# Patient Record
Sex: Female | Born: 1960 | Race: Black or African American | Hispanic: No | Marital: Married | Smoking: Never smoker
Health system: Southern US, Community
[De-identification: ages and names within clinical notes are randomized; demographics above are authoritative.]

## PROBLEM LIST (undated history)

## (undated) DIAGNOSIS — E039 Hypothyroidism, unspecified: Secondary | ICD-10-CM

## (undated) HISTORY — PX: TONSILLECTOMY: SUR1361

## (undated) HISTORY — PX: OTHER SURGICAL HISTORY: SHX169

## (undated) HISTORY — PX: LAPAROSCOPIC GASTRIC SLEEVE RESECTION: SHX5895

---

## 2004-12-03 ENCOUNTER — Ambulatory Visit: Payer: Self-pay | Admitting: Family Medicine

## 2005-01-15 ENCOUNTER — Ambulatory Visit: Payer: Self-pay | Admitting: Family Medicine

## 2005-08-14 ENCOUNTER — Ambulatory Visit: Payer: Self-pay | Admitting: Obstetrics and Gynecology

## 2006-03-17 ENCOUNTER — Ambulatory Visit: Payer: Self-pay | Admitting: *Deleted

## 2007-11-09 ENCOUNTER — Ambulatory Visit: Payer: Self-pay | Admitting: Family Medicine

## 2009-01-04 ENCOUNTER — Ambulatory Visit: Payer: Self-pay | Admitting: Family Medicine

## 2011-01-03 ENCOUNTER — Ambulatory Visit: Payer: Self-pay | Admitting: Family Medicine

## 2012-01-07 ENCOUNTER — Ambulatory Visit: Payer: Self-pay | Admitting: Family Medicine

## 2012-12-29 ENCOUNTER — Other Ambulatory Visit: Payer: Self-pay | Admitting: Bariatrics

## 2012-12-29 DIAGNOSIS — E669 Obesity, unspecified: Secondary | ICD-10-CM

## 2013-01-04 ENCOUNTER — Ambulatory Visit: Payer: Self-pay | Admitting: Bariatrics

## 2013-01-04 LAB — COMPREHENSIVE METABOLIC PANEL
Anion Gap: 3 — ABNORMAL LOW (ref 7–16)
Calcium, Total: 10 mg/dL (ref 8.5–10.1)
Co2: 30 mmol/L (ref 21–32)
Creatinine: 0.53 mg/dL — ABNORMAL LOW (ref 0.60–1.30)
Osmolality: 274 (ref 275–301)
Potassium: 3.9 mmol/L (ref 3.5–5.1)
SGOT(AST): 21 U/L (ref 15–37)
Sodium: 138 mmol/L (ref 136–145)
Total Protein: 7.5 g/dL (ref 6.4–8.2)

## 2013-01-04 LAB — AMYLASE: Amylase: 47 U/L (ref 25–115)

## 2013-01-04 LAB — CBC WITH DIFFERENTIAL/PLATELET
Basophil #: 0.1 10*3/uL (ref 0.0–0.1)
Basophil %: 0.9 %
Eosinophil #: 0.2 10*3/uL (ref 0.0–0.7)
Eosinophil %: 3.1 %
HGB: 11.8 g/dL — ABNORMAL LOW (ref 12.0–16.0)
Lymphocyte %: 38.9 %
MCH: 27.9 pg (ref 26.0–34.0)
MCHC: 33.7 g/dL (ref 32.0–36.0)
MCV: 83 fL (ref 80–100)
Monocyte #: 0.6 x10 3/mm (ref 0.2–0.9)
Neutrophil #: 3.1 10*3/uL (ref 1.4–6.5)
Neutrophil %: 48 %
Platelet: 423 10*3/uL (ref 150–440)
RBC: 4.25 10*6/uL (ref 3.80–5.20)
RDW: 14.6 % — ABNORMAL HIGH (ref 11.5–14.5)

## 2013-01-04 LAB — TSH: Thyroid Stimulating Horm: 0.015 u[IU]/mL — ABNORMAL LOW

## 2013-01-04 LAB — PROTIME-INR: Prothrombin Time: 12.6 secs (ref 11.5–14.7)

## 2013-01-04 LAB — FOLATE: Folic Acid: 14.9 ng/mL (ref 3.1–100.0)

## 2013-01-04 LAB — MAGNESIUM: Magnesium: 1.9 mg/dL

## 2013-01-04 LAB — IRON AND TIBC
Iron: 47 ug/dL — ABNORMAL LOW (ref 50–170)
Unbound Iron-Bind.Cap.: 264 ug/dL

## 2013-01-04 LAB — APTT: Activated PTT: 29.9 secs (ref 23.6–35.9)

## 2013-01-04 LAB — FERRITIN: Ferritin (ARMC): 53 ng/mL (ref 8–388)

## 2013-01-04 LAB — HEMOGLOBIN A1C: Hemoglobin A1C: 6.3 % (ref 4.2–6.3)

## 2013-01-07 ENCOUNTER — Ambulatory Visit
Admission: RE | Admit: 2013-01-07 | Discharge: 2013-01-07 | Disposition: A | Discharge status: Home / Self Care | Payer: BC Managed Care – PPO | Source: Ambulatory Visit | Attending: Bariatrics | Admitting: Bariatrics

## 2013-01-07 DIAGNOSIS — E669 Obesity, unspecified: Secondary | ICD-10-CM

## 2013-02-07 ENCOUNTER — Ambulatory Visit: Payer: Self-pay | Admitting: Bariatrics

## 2013-02-23 ENCOUNTER — Ambulatory Visit: Payer: Self-pay | Admitting: Bariatrics

## 2013-05-17 ENCOUNTER — Ambulatory Visit: Payer: Self-pay | Admitting: Family Medicine

## 2014-12-14 ENCOUNTER — Other Ambulatory Visit: Payer: Self-pay | Admitting: Family Medicine

## 2014-12-14 DIAGNOSIS — Z1231 Encounter for screening mammogram for malignant neoplasm of breast: Secondary | ICD-10-CM

## 2014-12-15 ENCOUNTER — Ambulatory Visit
Admission: RE | Admit: 2014-12-15 | Discharge: 2014-12-15 | Disposition: A | Discharge status: Home / Self Care | Payer: BC Managed Care – PPO | Source: Ambulatory Visit | Attending: Family Medicine | Admitting: Family Medicine

## 2014-12-15 DIAGNOSIS — Z1231 Encounter for screening mammogram for malignant neoplasm of breast: Secondary | ICD-10-CM | POA: Insufficient documentation

## 2015-12-12 ENCOUNTER — Other Ambulatory Visit: Payer: Self-pay | Admitting: Family Medicine

## 2015-12-12 DIAGNOSIS — Z1231 Encounter for screening mammogram for malignant neoplasm of breast: Secondary | ICD-10-CM

## 2015-12-25 ENCOUNTER — Other Ambulatory Visit: Payer: Self-pay | Admitting: Family Medicine

## 2015-12-25 ENCOUNTER — Ambulatory Visit
Admission: RE | Admit: 2015-12-25 | Discharge: 2015-12-25 | Disposition: A | Discharge status: Home / Self Care | Payer: BC Managed Care – PPO | Source: Ambulatory Visit | Attending: Family Medicine | Admitting: Family Medicine

## 2015-12-25 DIAGNOSIS — Z1231 Encounter for screening mammogram for malignant neoplasm of breast: Secondary | ICD-10-CM

## 2016-03-14 ENCOUNTER — Ambulatory Visit
Admission: RE | Admit: 2016-03-14 | Discharge: 2016-03-14 | Disposition: A | Discharge status: Home / Self Care | Payer: BC Managed Care – PPO | Source: Ambulatory Visit | Attending: Gastroenterology | Admitting: Gastroenterology

## 2016-03-14 ENCOUNTER — Encounter
Admission: RE | Disposition: A | Discharge status: Home / Self Care | Payer: Self-pay | Source: Ambulatory Visit | Attending: Gastroenterology

## 2016-03-14 ENCOUNTER — Ambulatory Visit: Payer: BC Managed Care – PPO | Admitting: Anesthesiology

## 2016-03-14 ENCOUNTER — Encounter: Payer: Self-pay | Admitting: *Deleted

## 2016-03-14 DIAGNOSIS — K5730 Diverticulosis of large intestine without perforation or abscess without bleeding: Secondary | ICD-10-CM | POA: Diagnosis not present

## 2016-03-14 DIAGNOSIS — E039 Hypothyroidism, unspecified: Secondary | ICD-10-CM | POA: Diagnosis not present

## 2016-03-14 DIAGNOSIS — D125 Benign neoplasm of sigmoid colon: Secondary | ICD-10-CM | POA: Diagnosis not present

## 2016-03-14 DIAGNOSIS — Z79899 Other long term (current) drug therapy: Secondary | ICD-10-CM | POA: Insufficient documentation

## 2016-03-14 DIAGNOSIS — Z1211 Encounter for screening for malignant neoplasm of colon: Secondary | ICD-10-CM | POA: Insufficient documentation

## 2016-03-14 HISTORY — DX: Hypothyroidism, unspecified: E03.9

## 2016-03-14 HISTORY — PX: COLONOSCOPY: SHX5424

## 2016-03-14 SURGERY — COLONOSCOPY
Anesthesia: General

## 2016-03-14 MED ORDER — PROPOFOL 500 MG/50ML IV EMUL
INTRAVENOUS | Status: DC | PRN
  Administered 2016-03-14: 140 ug/kg/min via INTRAVENOUS

## 2016-03-14 MED ORDER — PHENYLEPHRINE HCL 10 MG/ML IJ SOLN
INTRAMUSCULAR | Status: DC | PRN
  Administered 2016-03-14: 100 ug via INTRAVENOUS

## 2016-03-14 MED ORDER — SODIUM CHLORIDE 0.9 % IV SOLN: INTRAVENOUS | Status: DC

## 2016-03-14 MED ORDER — MIDAZOLAM HCL 2 MG/2ML IJ SOLN
INTRAMUSCULAR | Status: DC | PRN
  Administered 2016-03-14: 2 mg via INTRAVENOUS

## 2016-03-14 MED ORDER — LIDOCAINE HCL (CARDIAC) 20 MG/ML IV SOLN
INTRAVENOUS | Status: DC | PRN
  Administered 2016-03-14: 40 mg via INTRAVENOUS

## 2016-03-14 MED ORDER — PROPOFOL 10 MG/ML IV BOLUS
INTRAVENOUS | Status: DC | PRN
Start: 2016-03-14 — End: 2016-03-14
  Administered 2016-03-14: 40 mg via INTRAVENOUS

## 2016-03-14 MED ORDER — EPHEDRINE SULFATE 50 MG/ML IJ SOLN
INTRAMUSCULAR | Status: DC | PRN
  Administered 2016-03-14: 5 mg via INTRAVENOUS
  Administered 2016-03-14: 10 mg via INTRAVENOUS

## 2016-03-14 MED ORDER — FENTANYL CITRATE (PF) 100 MCG/2ML IJ SOLN
INTRAMUSCULAR | Status: DC | PRN
  Administered 2016-03-14: 50 ug via INTRAVENOUS

## 2016-03-14 MED ORDER — SODIUM CHLORIDE 0.9 % IV SOLN
INTRAVENOUS | Status: DC
  Administered 2016-03-14: 1000 mL via INTRAVENOUS
  Administered 2016-03-14: 08:00:00 via INTRAVENOUS

## 2016-03-14 NOTE — Op Note (Signed)
[facility] Gastroenterology Patient Name: Tracie Flores Procedure Date: 03/14/2016 7:37 AM MRN: [id number] Account #: [id number] Date of Birth: [birth year] Admit Type: Outpatient Age: 55 Room: [facility] ENDO ROOM 3 Gender: Female Note Status: Finalized Procedure:            Colonoscopy Indications:          Screening for colorectal malignant neoplasm, This is                        the patient's first colonoscopy Providers:            Lollie Sails, MD Referring MD:         No Local Md, MD (Referring MD) Medicines:            Monitored Anesthesia Care Complications:        No immediate complications. Procedure:            Pre-Anesthesia Assessment:                       - ASA Grade Assessment: III - A patient with severe                        systemic disease.                       After obtaining informed consent, the colonoscope was                        passed under direct vision. Throughout the procedure,                        the patient's blood pressure, pulse, and oxygen                        saturations were monitored continuously. The                        Colonoscope was introduced through the anus and                        advanced to the the cecum, identified by appendiceal                        orifice and ileocecal valve. The colonoscopy was                        performed without difficulty. The patient tolerated the                        procedure well. The quality of the bowel preparation                        was good. Findings:      A 2 mm polyp was found in the distal sigmoid colon. The polyp was       sessile. The polyp was removed with a cold biopsy forceps. Resection and       retrieval were complete.      A few small-mouthed diverticula were found in the sigmoid colon and       distal descending colon.      The retroflexed view of the  distal rectum and anal verge was normal and       showed no anal or rectal abnormalities.     The exam was otherwise normal throughout the examined colon.      The digital rectal exam was normal. Impression:           - One 2 mm polyp in the distal sigmoid colon, removed                        with a cold biopsy forceps. Resected and retrieved.                       - Diverticulosis in the sigmoid colon and in the distal                        descending colon.                       - The distal rectum and anal verge are normal on                        retroflexion view. Recommendation:       - Discharge patient to home.                       - Await pathology results.                       - Telephone GI clinic for pathology results in 1 week. Procedure Code(s):    --- Professional ---                       [phone], Colonoscopy, flexible; with biopsy, single or                        multiple Diagnosis Code(s):    --- Professional ---                       Z12.11, Encounter for screening for malignant neoplasm                        of colon                       D12.5, Benign neoplasm of sigmoid colon                       K57.30, Diverticulosis of large intestine without                        perforation or abscess without bleeding CPT copyright 2016 American Medical Association. All rights reserved. The codes documented in this report are preliminary and upon coder review may  be revised to meet current compliance requirements. Lollie Sails, MD 03/14/2016 8:05:08 AM This report has been signed electronically. Number of Addenda: 0 Note Initiated On: 03/14/2016 7:37 AM Scope Withdrawal Time: 0 hours 8 minutes 35 seconds  Total Procedure Duration: 0 hours 16 minutes 55 seconds       [facility]

## 2016-03-14 NOTE — Anesthesia Preprocedure Evaluation (Signed)
Anesthesia Evaluation  Patient identified by MRN, date of birth, ID band Patient awake    Reviewed: Allergy & Precautions, H&P , NPO status , Patient's Chart, lab work & pertinent test results  History of Anesthesia Complications Negative for: history of anesthetic complications  Airway Mallampati: II  TM Distance: >3 FB Neck ROM: full    Dental  (+) Teeth Intact   Pulmonary neg shortness of breath, sleep apnea ,    Pulmonary exam normal breath sounds clear to auscultation       Cardiovascular Exercise Tolerance: Good (-) angina(-) Past MI and (-) DOE negative cardio ROS Normal cardiovascular exam Rhythm:regular Rate:Normal     Neuro/Psych negative neurological ROS  negative psych ROS   GI/Hepatic negative GI ROS, Neg liver ROS,   Endo/Other  Hypothyroidism   Renal/GU negative Renal ROS  negative genitourinary   Musculoskeletal   Abdominal   Peds  Hematology negative hematology ROS (+)   Anesthesia Other Findings Past Medical History: No date: Hypothyroidism  Past Surgical History: No date: ablasion No date: LAPAROSCOPIC GASTRIC SLEEVE RESECTION No date: TONSILLECTOMY  BMI    Body Mass Index:  29.12 kg/m      Reproductive/Obstetrics negative OB ROS                             Anesthesia Physical Anesthesia Plan  ASA: III  Anesthesia Plan: General   Post-op Pain Management:    Induction:   Airway Management Planned:   Additional Equipment:   Intra-op Plan:   Post-operative Plan:   Informed Consent: I have reviewed the patients History and Physical, chart, labs and discussed the procedure including the risks, benefits and alternatives for the proposed anesthesia with the patient or authorized representative who has indicated his/her understanding and acceptance.   Dental Advisory Given  Plan Discussed with: Anesthesiologist, CRNA and Surgeon  Anesthesia Plan  Comments:         Anesthesia Quick Evaluation

## 2016-03-14 NOTE — Anesthesia Postprocedure Evaluation (Deleted)
Anesthesia Post Note  Patient: LENIYA FREGOSO  Procedure(s) Performed: Procedure(s) (LRB): COLONOSCOPY (N/A)  Patient location during evaluation: Endoscopy Anesthesia Type: General Level of consciousness: awake and alert Pain management: pain level controlled Vital Signs Assessment: post-procedure vital signs reviewed and stable Respiratory status: spontaneous breathing, nonlabored ventilation, respiratory function stable and patient connected to nasal cannula oxygen Cardiovascular status: blood pressure returned to baseline and stable Postop Assessment: no signs of nausea or vomiting Anesthetic complications: no    Last Vitals:  Vitals:   03/14/16 0712 03/14/16 0810  BP: 137/88 127/79  Pulse: 62 66  Resp: 16 14  Temp: 36.1 C (!) 35.8 C    Last Pain:  Vitals:   03/14/16 0810  TempSrc: Tympanic                 Precious Haws Piscitello

## 2016-03-14 NOTE — Anesthesia Postprocedure Evaluation (Signed)
Anesthesia Post Note  Patient: Tracie Flores  Procedure(s) Performed: Procedure(s) (LRB): COLONOSCOPY (N/A)  Patient location during evaluation: Endoscopy Anesthesia Type: General Level of consciousness: awake and alert Pain management: pain level controlled Vital Signs Assessment: post-procedure vital signs reviewed and stable Respiratory status: spontaneous breathing, nonlabored ventilation, respiratory function stable and patient connected to nasal cannula oxygen Cardiovascular status: blood pressure returned to baseline and stable Postop Assessment: no signs of nausea or vomiting Anesthetic complications: no    Last Vitals:  Vitals:   03/14/16 0830 03/14/16 0840  BP: 137/80 (!) 149/90  Pulse: (!) 56 61  Resp: (!) 21 12  Temp:      Last Pain:  Vitals:   03/14/16 0810  TempSrc: Tympanic                 Precious Haws Piscitello

## 2016-03-14 NOTE — H&P (Signed)
Outpatient short stay form Pre-procedure 03/14/2016 7:36 AM Lollie Sails MD  Primary Physician: Dr. Juluis Pitch  Reason for visit:  Screening colonoscopy  History of present illness:  Patient is a 55 year old female presenting today as above. She tolerated her prep well. She takes no regular aspirin products she takes no blood thinning agents. This is her first colonoscopy.    Current Facility-Administered Medications:  .  0.9 %  sodium chloride infusion, , Intravenous, Continuous, Lollie Sails, MD, Last Rate: 20 mL/hr at 03/14/16 0731, 1,000 mL at 03/14/16 0731 .  0.9 %  sodium chloride infusion, , Intravenous, Continuous, Lollie Sails, MD  Prescriptions Prior to Admission  Medication Sig Dispense Refill Last Dose  . Biotin w/ Vitamins C & E (HAIR/SKIN/NAILS PO) Take by mouth.     . Collagen Hydrolysate, Bovine, POWD      . levothyroxine (SYNTHROID, LEVOTHROID) 125 MCG tablet Take 125 mcg by mouth daily. Take on empty styomach with a glass of water at least 30-60 minutes before breakfast     . Multiple Vitamin (MULTIVITAMIN) tablet Take 1 tablet by mouth daily.        No Known Allergies   Past Medical History:  Diagnosis Date  . Hypothyroidism     Review of systems:      Physical Exam    Heart and lungs: Regular rate and rhythm without rub or gallop, lungs are bilaterally clear.    HEENT: Normocephalic atraumatic eyes are anicteric    Other:     Pertinant exam for procedure: Soft nontender nondistended bowel sounds positive normoactive.    Planned proceedures: Colonoscopy and indicated procedures. I have discussed the risks benefits and complications of procedures to include not limited to bleeding, infection, perforation and the risk of sedation and the patient wishes to proceed.    Lollie Sails, MD Gastroenterology 03/14/2016  7:36 AM

## 2016-03-14 NOTE — Transfer of Care (Signed)
Immediate Anesthesia Transfer of Care Note  Patient: Tracie Flores  Procedure(s) Performed: Procedure(s): COLONOSCOPY (N/A)  Patient Location: PACU  Anesthesia Type:General  Level of Consciousness: awake  Airway & Oxygen Therapy: Patient Spontanous Breathing and Patient connected to nasal cannula oxygen  Post-op Assessment: Report given to RN and Post -op Vital signs reviewed and stable  Post vital signs: Reviewed and stable  Last Vitals:  Vitals:   03/14/16 0712 03/14/16 0810  BP: 137/88 127/79  Pulse: 62 66  Resp: 16 14  Temp: 36.1 C (!) 35.8 C    Last Pain:  Vitals:   03/14/16 0810  TempSrc: Tympanic         Complications: No apparent anesthesia complications

## 2016-03-14 NOTE — Anesthesia Procedure Notes (Signed)
Date/Time: 03/14/2016 7:44 AM Performed by: Allean Found Pre-anesthesia Checklist: Patient identified, Emergency Drugs available, Suction available, Patient being monitored and Timeout performed Patient Re-evaluated:Patient Re-evaluated prior to inductionOxygen Delivery Method: Nasal cannula Intubation Type: IV induction Comments: Natural airway

## 2016-03-15 ENCOUNTER — Encounter: Payer: Self-pay | Admitting: Gastroenterology

## 2016-03-17 LAB — SURGICAL PATHOLOGY

## 2016-06-23 ENCOUNTER — Other Ambulatory Visit: Payer: Self-pay | Admitting: Family Medicine

## 2016-06-23 DIAGNOSIS — N644 Mastodynia: Secondary | ICD-10-CM

## 2016-07-04 ENCOUNTER — Other Ambulatory Visit: Payer: BC Managed Care – PPO

## 2016-07-04 ENCOUNTER — Ambulatory Visit: Payer: BC Managed Care – PPO

## 2016-10-10 ENCOUNTER — Ambulatory Visit
Admission: RE | Admit: 2016-10-10 | Discharge: 2016-10-10 | Disposition: A | Discharge status: Home / Self Care | Payer: BC Managed Care – PPO | Source: Ambulatory Visit | Attending: Family Medicine | Admitting: Family Medicine

## 2016-10-10 DIAGNOSIS — N644 Mastodynia: Secondary | ICD-10-CM

## 2017-05-20 ENCOUNTER — Other Ambulatory Visit: Payer: Self-pay | Admitting: Family Medicine

## 2017-05-20 DIAGNOSIS — Z1231 Encounter for screening mammogram for malignant neoplasm of breast: Secondary | ICD-10-CM

## 2017-06-09 ENCOUNTER — Ambulatory Visit
Admission: RE | Admit: 2017-06-09 | Discharge: 2017-06-09 | Disposition: A | Discharge status: Home / Self Care | Payer: BC Managed Care – PPO | Source: Ambulatory Visit | Attending: Family Medicine | Admitting: Family Medicine

## 2017-06-09 DIAGNOSIS — Z1231 Encounter for screening mammogram for malignant neoplasm of breast: Secondary | ICD-10-CM | POA: Diagnosis present

## 2017-08-19 ENCOUNTER — Other Ambulatory Visit: Payer: Self-pay

## 2017-08-19 ENCOUNTER — Emergency Department
Admission: EM | Admit: 2017-08-19 | Discharge: 2017-08-19 | Disposition: A | Discharge status: Home / Self Care | Payer: BC Managed Care – PPO | Attending: Emergency Medicine | Admitting: Emergency Medicine

## 2017-08-19 ENCOUNTER — Emergency Department: Payer: BC Managed Care – PPO

## 2017-08-19 DIAGNOSIS — M546 Pain in thoracic spine: Secondary | ICD-10-CM | POA: Diagnosis not present

## 2017-08-19 DIAGNOSIS — G8929 Other chronic pain: Secondary | ICD-10-CM | POA: Diagnosis not present

## 2017-08-19 DIAGNOSIS — E039 Hypothyroidism, unspecified: Secondary | ICD-10-CM | POA: Insufficient documentation

## 2017-08-19 DIAGNOSIS — N644 Mastodynia: Secondary | ICD-10-CM

## 2017-08-19 LAB — CBC
HEMATOCRIT: 37.8 % (ref 35.0–47.0)
Hemoglobin: 12.5 g/dL (ref 12.0–16.0)
MCH: 29.4 pg (ref 26.0–34.0)
MCHC: 33 g/dL (ref 32.0–36.0)
MCV: 89.1 fL (ref 80.0–100.0)
PLATELETS: 349 10*3/uL (ref 150–440)
RBC: 4.25 MIL/uL (ref 3.80–5.20)
RDW: 13.5 % (ref 11.5–14.5)
WBC: 2.8 10*3/uL — AB (ref 3.6–11.0)

## 2017-08-19 LAB — BASIC METABOLIC PANEL
Anion gap: 8 (ref 5–15)
BUN: 11 mg/dL (ref 6–20)
CO2: 27 mmol/L (ref 22–32)
CREATININE: 0.6 mg/dL (ref 0.44–1.00)
Calcium: 10 mg/dL (ref 8.9–10.3)
Chloride: 103 mmol/L (ref 101–111)
GFR calc Af Amer: >60 mL/min (ref >60)
GLUCOSE: 95 mg/dL (ref 65–99)
Potassium: 4.1 mmol/L (ref 3.5–5.1)
SODIUM: 138 mmol/L (ref 135–145)

## 2017-08-19 LAB — FIBRIN DERIVATIVES D-DIMER (ARMC ONLY): FIBRIN DERIVATIVES D-DIMER (ARMC): 431.69 ng{FEU}/mL (ref 0.00–499.00)

## 2017-08-19 LAB — TROPONIN I: Troponin I: <0.03 ng/mL (ref <0.03)

## 2017-08-19 MED ORDER — HYDROMORPHONE HCL 1 MG/ML IJ SOLN
0.5000 mg | Freq: Once | INTRAMUSCULAR | Status: AC
  Administered 2017-08-19: 0.5 mg via INTRAVENOUS
  Filled 2017-08-19: qty 1

## 2017-08-19 MED ORDER — DIAZEPAM 5 MG PO TABS: 5.0000 mg | ORAL_TABLET | Freq: Three times a day (TID) | ORAL | 0 refills | Status: AC | PRN

## 2017-08-19 MED ORDER — HYDROMORPHONE HCL 1 MG/ML IJ SOLN
1.0000 mg | Freq: Once | INTRAMUSCULAR | Status: AC
Start: 2017-08-19 — End: 2017-08-19
  Administered 2017-08-19: 1 mg via INTRAVENOUS
  Filled 2017-08-19: qty 1

## 2017-08-19 MED ORDER — IOPAMIDOL (ISOVUE-370) INJECTION 76%
75.0000 mL | Freq: Once | INTRAVENOUS | Status: AC | PRN
  Administered 2017-08-19: 75 mL via INTRAVENOUS

## 2017-08-19 NOTE — ED Notes (Signed)
First Nurse Note: Pt tearful at desk, states that her left breast and back are hurting her. Denies it being chest pain. No N/V, diaphoresis.

## 2017-08-19 NOTE — ED Provider Notes (Signed)
[facility] Emergency Department Provider Note       Time seen: ----------------------------------------- 8:07 AM on 08/19/2017 -----------------------------------------   I have reviewed the triage vital signs and the nursing notes.  HISTORY   Chief Complaint Back Pain and Breast Pain    HPI Tracie Flores is a 57 y.o. female with a history of hypothyroidism who presents to the ED for left-sided breast tenderness and pain that is recurrent.  Patient states she also started having upper back pain and right shoulder blade pain that is now in the left shoulder blade for the last week.  She has seen her doctor for similar before, currently is taking Vicodin and Flexeril for similar.  She states she has had extra mammograms because of this left breast pain with no specific diagnosis given.  Past Medical History:  Diagnosis Date  . Hypothyroidism     There are no active problems to display for this patient.   Past Surgical History:  Procedure Laterality Date  . ablasion    . COLONOSCOPY N/A 03/14/2016   Procedure: COLONOSCOPY;  Surgeon: Lollie Sails, MD;  Location: [facility] ENDOSCOPY;  Service: Endoscopy;  Laterality: N/A;  . LAPAROSCOPIC GASTRIC SLEEVE RESECTION    . TONSILLECTOMY      Allergies Patient has no known allergies.  Social History Social History   Tobacco Use  . Smoking status: Never Smoker  . Smokeless tobacco: Never Used  Substance Use Topics  . Alcohol use: Not on file  . Drug use: Not on file   Review of Systems Constitutional: Negative for fever. Cardiovascular: Negative for chest pain. Respiratory: Negative for shortness of breath. Gastrointestinal: Negative for abdominal pain, vomiting and diarrhea. Musculoskeletal: Positive for back pain, left breast pain Skin: Negative for rash. Neurological: Negative for headaches, focal weakness or numbness.  All systems negative/normal/unremarkable except as stated in the  HPI  ____________________________________________   PHYSICAL EXAM:  VITAL SIGNS: ED Triage Vitals [08/19/17 0801]  Enc Vitals Group     BP 133/90     Pulse Rate 76     Resp 18     Temp 98.2 F (36.8 C)     Temp Source Oral     SpO2 99 %     Weight 195 lb (88.5 kg)     Height 5\' 5"  (1.651 m)     Head Circumference      Peak Flow      Pain Score 9     Pain Loc      Pain Edu?      Excl. in [place]?    Constitutional: Alert and oriented.  Tearful, no distress Eyes: Conjunctivae are normal. Normal extraocular movements. ENT   Head: Normocephalic and atraumatic.   Nose: No congestion/rhinnorhea.   Mouth/Throat: Mucous membranes are moist.   Neck: No stridor. Cardiovascular: Normal rate, regular rhythm. No murmurs, rubs, or gallops. Respiratory: Normal respiratory effort without tachypnea nor retractions. Breath sounds are clear and equal bilaterally. No wheezes/rales/rhonchi. Gastrointestinal: Soft and nontender. Normal bowel sounds Musculoskeletal: Nontender with normal range of motion in extremities. No lower extremity tenderness nor edema.  No specific breast mass or breast tenderness is noted, no specific back tenderness is noted Neurologic:  Normal speech and language. No gross focal neurologic deficits are appreciated.  Skin:  Skin is warm, dry and intact. No rash noted. Psychiatric: Mood and affect are normal. Speech and behavior are normal.  ____________________________________________  EKG: Interpreted by me.  Normal sinus rhythm the rate is  66 bpm, possible LVH, flat T waves, leftward axis  ____________________________________________  ED COURSE:  As part of my medical decision making, I reviewed the following data within the [place] History obtained from family if available, nursing notes, old chart and ekg, as well as notes from prior ED visits. Patient presented for chest and back pain, we will assess with labs and imaging as  indicated at this time.   Procedures ____________________________________________   LABS (pertinent positives/negatives)  Labs Reviewed  CBC - Abnormal; Notable for the following components:      Result Value   WBC 2.8 (*)    All other components within normal limits  BASIC METABOLIC PANEL  TROPONIN I  FIBRIN DERIVATIVES D-DIMER ([facility] ONLY)    RADIOLOGY  Chest x-ray IMPRESSION: Aortic atherosclerosis.  Lungs clear.  Aortic Atherosclerosis (ICD10-I70.0). CTA chest IMPRESSION: No definite evidence of pulmonary embolus. No acute abnormality is seen in the chest.  ____________________________________________  DIFFERENTIAL DIAGNOSIS   Musculoskeletal pain, chronic pain, degenerative disc disease, PE  FINAL ASSESSMENT AND PLAN  Back pain, left breast pain   Plan: The patient had presented for back and left-sided breast pain. Patient's labs were reassuring. Patient's imaging was also reassuring.  No clear etiology for her pain that appears to be chronic pain at this point.  I will advise close outpatient follow-up with her doctor.   Laurence Aly, MD   Note: This note was generated in part or whole with voice recognition software. Voice recognition is usually quite accurate but there are transcription errors that can and very often do occur. I apologize for any typographical errors that were not detected and corrected.     Earleen Newport, MD 08/19/17 1056

## 2017-08-19 NOTE — ED Notes (Signed)
Patient complaining of pain to left breast, as well as left and right shoulder blades. Patient denies any injury. No swelling, redness noted. Patient denies feeling lump in breast.

## 2017-08-19 NOTE — ED Triage Notes (Signed)
C/o left breast tenderness pain, recurrent. Pt has also started with upper back pain, started in right shoulder blade and now to left shoulder blade x 1 week. Pt tearful. Denies CP. Pt alert and oriented X4, active, cooperative, pt in NAD. RR even and unlabored, color WNL.

## 2017-08-19 NOTE — ED Notes (Signed)
Pt taken to POV in wheelchair. She was able to ambulate without difficulty. VSS. NAD. Pain is under control. Discharge instructions, RX and follow up discussed.

## 2018-02-04 ENCOUNTER — Other Ambulatory Visit
Admission: RE | Admit: 2018-02-04 | Discharge: 2018-02-04 | Disposition: A | Discharge status: Home / Self Care | Payer: BC Managed Care – PPO | Source: Ambulatory Visit | Attending: Pediatrics | Admitting: Pediatrics

## 2018-02-04 DIAGNOSIS — M25561 Pain in right knee: Secondary | ICD-10-CM | POA: Insufficient documentation

## 2018-02-04 LAB — FIBRIN DERIVATIVES D-DIMER (ARMC ONLY): Fibrin derivatives D-dimer (ARMC): 406.82 ng/mL (FEU) (ref 0.00–499.00)

## 2018-12-02 ENCOUNTER — Other Ambulatory Visit: Payer: Self-pay | Admitting: Family Medicine

## 2018-12-02 DIAGNOSIS — Z1231 Encounter for screening mammogram for malignant neoplasm of breast: Secondary | ICD-10-CM

## 2018-12-15 ENCOUNTER — Ambulatory Visit
Admission: RE | Admit: 2018-12-15 | Discharge: 2018-12-15 | Disposition: A | Discharge status: Home / Self Care | Payer: BC Managed Care – PPO | Source: Ambulatory Visit | Attending: Family Medicine | Admitting: Family Medicine

## 2018-12-15 ENCOUNTER — Other Ambulatory Visit: Payer: Self-pay

## 2018-12-15 ENCOUNTER — Encounter (INDEPENDENT_AMBULATORY_CARE_PROVIDER_SITE_OTHER): Payer: Self-pay

## 2018-12-15 DIAGNOSIS — Z1231 Encounter for screening mammogram for malignant neoplasm of breast: Secondary | ICD-10-CM | POA: Insufficient documentation

## 2019-04-08 ENCOUNTER — Encounter: Payer: Self-pay | Admitting: Orthopaedic Surgery

## 2019-04-08 ENCOUNTER — Ambulatory Visit: Payer: Self-pay

## 2019-04-08 ENCOUNTER — Ambulatory Visit: Payer: BC Managed Care – PPO | Admitting: Orthopaedic Surgery

## 2019-04-08 ENCOUNTER — Other Ambulatory Visit: Payer: Self-pay

## 2019-04-08 DIAGNOSIS — M1711 Unilateral primary osteoarthritis, right knee: Secondary | ICD-10-CM

## 2019-04-08 MED ORDER — METHYLPREDNISOLONE ACETATE 40 MG/ML IJ SUSP
40.0000 mg | INTRAMUSCULAR | Status: AC | PRN
  Administered 2019-04-08: 40 mg via INTRA_ARTICULAR

## 2019-04-08 MED ORDER — LIDOCAINE HCL 1 % IJ SOLN
2.0000 mL | INTRAMUSCULAR | Status: AC | PRN
  Administered 2019-04-08: 2 mL

## 2019-04-08 MED ORDER — BUPIVACAINE HCL 0.25 % IJ SOLN
2.0000 mL | INTRAMUSCULAR | Status: AC | PRN
  Administered 2019-04-08: 09:00:00 2 mL via INTRA_ARTICULAR

## 2019-04-08 NOTE — Progress Notes (Signed)
Office Visit Note   Patient: Tracie Flores           Date of Birth: 1960/11/13           MRN: [id number] Visit Date: 04/08/2019              Requested by: Juluis Pitch, MD [phone] [place],  [place] [id number] PCP: Juluis Pitch, MD   Assessment & Plan: Visit Diagnoses:  1. Primary osteoarthritis of right knee     Plan: Impression is right knee osteoarthritis.  We will proceed with cortisone injection today.  I did discuss viscosupplementation injection should she fail to get relief from cortisone.  She will call and let us know.  Follow-up with [place] as needed.  Follow-Up Instructions: Return if symptoms worsen or fail to improve.   Orders:  Orders Placed This Encounter  Procedures  . Large Joint Inj: R knee  . XR Knee Complete 4 Views Right   No orders of the defined types were placed in this encounter.     Procedures: Large Joint Inj: R knee on 04/08/2019 9:06 AM Indications: pain Details: 22 G needle, anterolateral approach Medications: 2 mL bupivacaine 0.25 %; 2 mL lidocaine 1 %; 40 mg methylPREDNISolone acetate 40 MG/ML      Clinical Data: No additional findings.   Subjective: Chief Complaint  Patient presents with  . Right Knee - Pain    HPI patient is a pleasant 58 year old female who presents our clinic today with right knee pain.  This began last year.  Her pain worsened recently when going up a set of stairs after sitting in a low chair for too long period of time.  No specific injury.  Majority of her pain is to the lateral aspect with occasional sharp shooting pain medially.  She has associated stiffness and occasional locking.  Pain is worse going up and down stairs.  She has tried a brace as well as Tylenol without relief of symptoms.  She has been to the chiropractor without any relief.  No previous injection or surgical intervention.  Review of Systems as detailed in HPI.  All others reviewed and are negative.   Objective: Vital  Signs: There were no vitals taken for this visit.  Physical Exam well-developed and well-nourished female in no acute distress.  Alert and oriented x3.  Ortho Exam examination of her right knee shows a valgus deformity.  Range of motion 0 to 100 degrees.  Medial and lateral joint line tenderness.  Mild to moderate patellofemoral crepitus.  Ligaments are stable.  She is neurovascular intact distally.  Specialty Comments:  No specialty comments available.  Imaging: Xr Knee Complete 4 Views Right  Result Date: 04/08/2019 X-rays demonstrate moderate joint space narrowing lateral and patellofemoral compartments with a valgus deformity    PMFS History: There are no active problems to display for this patient.  Past Medical History:  Diagnosis Date  . Hypothyroidism     Family History  Problem Relation Age of Onset  . Breast cancer Mother 87       diagnosed in 2016    Past Surgical History:  Procedure Laterality Date  . ablasion    . COLONOSCOPY N/A 03/14/2016   Procedure: COLONOSCOPY;  Surgeon: Lollie Sails, MD;  Location: [facility] ENDOSCOPY;  Service: Endoscopy;  Laterality: N/A;  . LAPAROSCOPIC GASTRIC SLEEVE RESECTION    . TONSILLECTOMY     Social History   Occupational History  . Not on file  Tobacco  Use  . Smoking status: Never Smoker  . Smokeless tobacco: Never Used  Substance and Sexual Activity  . Alcohol use: Not on file  . Drug use: Not on file  . Sexual activity: Not on file

## 2019-05-17 ENCOUNTER — Ambulatory Visit: Payer: BC Managed Care – PPO | Admitting: Orthopaedic Surgery

## 2019-07-24 ENCOUNTER — Ambulatory Visit: Payer: BC Managed Care – PPO | Attending: Internal Medicine

## 2019-07-24 DIAGNOSIS — Z23 Encounter for immunization: Secondary | ICD-10-CM

## 2019-07-24 NOTE — Progress Notes (Signed)
   Covid-19 Vaccination Clinic  Name:  Tracie Flores    MRN: [id number] DOB: [birth year]  07/24/2019  Tracie Flores was observed post Covid-19 immunization for 15 minutes without incidence. She was provided with Vaccine Information Sheet and instruction to access the V-Safe system.   Tracie Flores was instructed to call 911 with any severe reactions post vaccine: Marland Kitchen Difficulty breathing  . Swelling of your face and throat  . A fast heartbeat  . A bad rash all over your body  . Dizziness and weakness    Immunizations Administered    Name Date Dose VIS Date Route   Pfizer COVID-19 Vaccine 07/24/2019 11:26 AM 0.3 mL 05/06/2019 Intramuscular   Manufacturer: [place]   Lot: [id number]   [place]: [id number]

## 2019-08-12 ENCOUNTER — Other Ambulatory Visit: Payer: Self-pay

## 2019-08-12 ENCOUNTER — Ambulatory Visit: Payer: BC Managed Care – PPO | Admitting: Orthopaedic Surgery

## 2019-08-12 ENCOUNTER — Encounter: Payer: Self-pay | Admitting: Orthopaedic Surgery

## 2019-08-12 DIAGNOSIS — M1711 Unilateral primary osteoarthritis, right knee: Secondary | ICD-10-CM | POA: Insufficient documentation

## 2019-08-12 NOTE — Progress Notes (Signed)
   Office Visit Note   Patient: Tracie Flores           Date of Birth: January 10, 1961           MRN: [id number] Visit Date: 08/12/2019              Requested by: Juluis Pitch, MD [phone] [place],  [place] [id number] PCP: Juluis Pitch, MD   Assessment & Plan: Visit Diagnoses:  1. Primary osteoarthritis of right knee     Plan: Impression is advanced right knee DJD with valgus deformity.  We aspirated the effusion and injected cortisone today.  Patient tolerated this well.  She understands that her DJD is severe and will likely be facing a knee replacement in the near future.  Questions encouraged and answered.  Follow-up as needed.  Follow-Up Instructions: Return if symptoms worsen or fail to improve.   Orders:  No orders of the defined types were placed in this encounter.  No orders of the defined types were placed in this encounter.     Procedures: No procedures performed   Clinical Data: No additional findings.   Subjective: Chief Complaint  Patient presents with  . Right Knee - Pain    Tracie Flores is returning today for chronic right knee pain.  We saw her in November and provide her with a cortisone injection which helped partially.  She continues to have sharp pain with feelings of giving out with cracking and popping.  She feels like overall the pain is getting worse and she is having more difficulty with ADLs.  She would like to try another injection today.   Review of Systems   Objective: Vital Signs: There were no vitals taken for this visit.  Physical Exam  Ortho Exam Right knee shows a small joint effusion.  Valgus deformity.  Moderate pain with range of motion.  Slight restriction in range of motion. Specialty Comments:  No specialty comments available.  Imaging: No results found.   PMFS History: Patient Active Problem List   Diagnosis Date Noted  . Primary osteoarthritis of right knee 08/12/2019   Past Medical History:  Diagnosis Date   . Hypothyroidism     Family History  Problem Relation Age of Onset  . Breast cancer Mother 67       diagnosed in 2016    Past Surgical History:  Procedure Laterality Date  . ablasion    . COLONOSCOPY N/A 03/14/2016   Procedure: COLONOSCOPY;  Surgeon: Lollie Sails, MD;  Location: [facility] ENDOSCOPY;  Service: Endoscopy;  Laterality: N/A;  . LAPAROSCOPIC GASTRIC SLEEVE RESECTION    . TONSILLECTOMY     Social History   Occupational History  . Not on file  Tobacco Use  . Smoking status: Never Smoker  . Smokeless tobacco: Never Used  Substance and Sexual Activity  . Alcohol use: Not on file  . Drug use: Not on file  . Sexual activity: Not on file

## 2019-08-16 ENCOUNTER — Ambulatory Visit: Payer: BC Managed Care – PPO

## 2019-08-17 ENCOUNTER — Ambulatory Visit: Payer: BC Managed Care – PPO | Attending: Internal Medicine

## 2019-08-17 DIAGNOSIS — Z23 Encounter for immunization: Secondary | ICD-10-CM

## 2019-08-17 NOTE — Progress Notes (Signed)
   Covid-19 Vaccination Clinic  Name:  Tracie Flores    MRN: [id number] DOB: 1960/10/01  08/17/2019  [name] was observed post Covid-19 immunization for 15 minutes without incident. She was provided with Vaccine Information Sheet and instruction to access the V-Safe system.   [name] was instructed to call 911 with any severe reactions post vaccine: Marland Kitchen Difficulty breathing  . Swelling of face and throat  . A fast heartbeat  . A bad rash all over body  . Dizziness and weakness   Immunizations Administered    Name Date Dose VIS Date Route   Pfizer COVID-19 Vaccine 08/17/2019  8:18 AM 0.3 mL 05/06/2019 Intramuscular   Manufacturer: [employer]   Lot: [id number]   [place]: [id number]

## 2020-04-03 ENCOUNTER — Other Ambulatory Visit: Payer: Self-pay

## 2020-04-03 ENCOUNTER — Ambulatory Visit: Payer: BC Managed Care – PPO | Admitting: Orthopaedic Surgery

## 2020-04-03 DIAGNOSIS — M1711 Unilateral primary osteoarthritis, right knee: Secondary | ICD-10-CM

## 2020-04-03 MED ORDER — LIDOCAINE HCL 1 % IJ SOLN
2.0000 mL | INTRAMUSCULAR | Status: AC | PRN
  Administered 2020-04-03: 2 mL

## 2020-04-03 MED ORDER — BUPIVACAINE HCL 0.5 % IJ SOLN
2.0000 mL | INTRAMUSCULAR | Status: AC | PRN
  Administered 2020-04-03: 2 mL via INTRA_ARTICULAR

## 2020-04-03 MED ORDER — METHYLPREDNISOLONE ACETATE 40 MG/ML IJ SUSP
40.0000 mg | INTRAMUSCULAR | Status: AC | PRN
  Administered 2020-04-03: 40 mg via INTRA_ARTICULAR

## 2020-04-03 NOTE — Progress Notes (Signed)
   Office Visit Note   Patient: Tracie Flores           Date of Birth: 07/13/1960           MRN: [id number] Visit Date: 04/03/2020              Requested by: Juluis Pitch, MD [phone] [place],  [place] [address] PCP: Juluis Pitch, MD   Assessment & Plan: Visit Diagnoses:  1. Primary osteoarthritis of right knee     Plan: Impression is right knee DJD with valgus deformity and knee effusion.  Aspiration injection performed today which yielded 22 cc of joint fluid.  Patient tolerated the procedure well.  We will see her back as needed.  Follow-Up Instructions: Return if symptoms worsen or fail to improve.   Orders:  No orders of the defined types were placed in this encounter.  No orders of the defined types were placed in this encounter.     Procedures: Large Joint Inj: R knee on 04/03/2020 4:51 PM Indications: pain Details: 22 G needle  Arthrogram: No  Medications: 40 mg methylPREDNISolone acetate 40 MG/ML; 2 mL lidocaine 1 %; 2 mL bupivacaine 0.5 % Aspirate: clear Outcome: tolerated well, no immediate complications Consent was given by the patient. Patient was prepped and draped in the usual sterile fashion.       Clinical Data: No additional findings.   Subjective: Chief Complaint  Patient presents with  . Right Knee - Pain    Tracie Flores returns today for recurrent right knee joint effusion.  She does have valgus deformity DJD.  Prior aspiration injection done in March of this year was very effective.  She would like to have this repeated.  She has also recently gained a small amount of weight which has noticeably increased her knee pain.   Review of Systems   Objective: Vital Signs: There were no vitals taken for this visit.  Physical Exam  Ortho Exam Right knee shows a medium joint effusion.  No signs of infection.  Mild flexion contracture as well as valgus deformity. Specialty Comments:  No specialty comments available.  Imaging: No  results found.   PMFS History: Patient Active Problem List   Diagnosis Date Noted  . Primary osteoarthritis of right knee 08/12/2019   Past Medical History:  Diagnosis Date  . Hypothyroidism     Family History  Problem Relation Age of Onset  . Breast cancer Mother 3       diagnosed in 2016    Past Surgical History:  Procedure Laterality Date  . ablasion    . COLONOSCOPY N/A 03/14/2016   Procedure: COLONOSCOPY;  Surgeon: Lollie Sails, MD;  Location: [facility] ENDOSCOPY;  Service: Endoscopy;  Laterality: N/A;  . LAPAROSCOPIC GASTRIC SLEEVE RESECTION    . TONSILLECTOMY     Social History   Occupational History  . Not on file  Tobacco Use  . Smoking status: Never Smoker  . Smokeless tobacco: Never Used  Substance and Sexual Activity  . Alcohol use: Not on file  . Drug use: Not on file  . Sexual activity: Not on file

## 2020-07-17 ENCOUNTER — Other Ambulatory Visit: Payer: Self-pay | Admitting: Family Medicine

## 2020-07-19 ENCOUNTER — Other Ambulatory Visit: Payer: Self-pay | Admitting: Family Medicine

## 2020-07-19 DIAGNOSIS — Z1231 Encounter for screening mammogram for malignant neoplasm of breast: Secondary | ICD-10-CM

## 2020-08-09 ENCOUNTER — Ambulatory Visit
Admission: RE | Admit: 2020-08-09 | Discharge: 2020-08-09 | Disposition: A | Discharge status: Home / Self Care | Payer: BC Managed Care – PPO | Source: Ambulatory Visit | Attending: Family Medicine | Admitting: Family Medicine

## 2020-08-09 ENCOUNTER — Other Ambulatory Visit: Payer: Self-pay

## 2020-08-09 DIAGNOSIS — Z1231 Encounter for screening mammogram for malignant neoplasm of breast: Secondary | ICD-10-CM

## 2021-02-24 IMAGING — MG DIGITAL SCREENING BILATERAL MAMMOGRAM WITH TOMO AND CAD
8 series · 8 of 24 positions shown · non-contrast
Comparison: Previous exam(s).

ACR Breast Density Category a: The breast tissue is almost entirely
fatty.

CLINICAL DATA: Screening.

EXAM:
DIGITAL SCREENING BILATERAL MAMMOGRAM WITH TOMO AND CAD

[L MLO synth-2D]
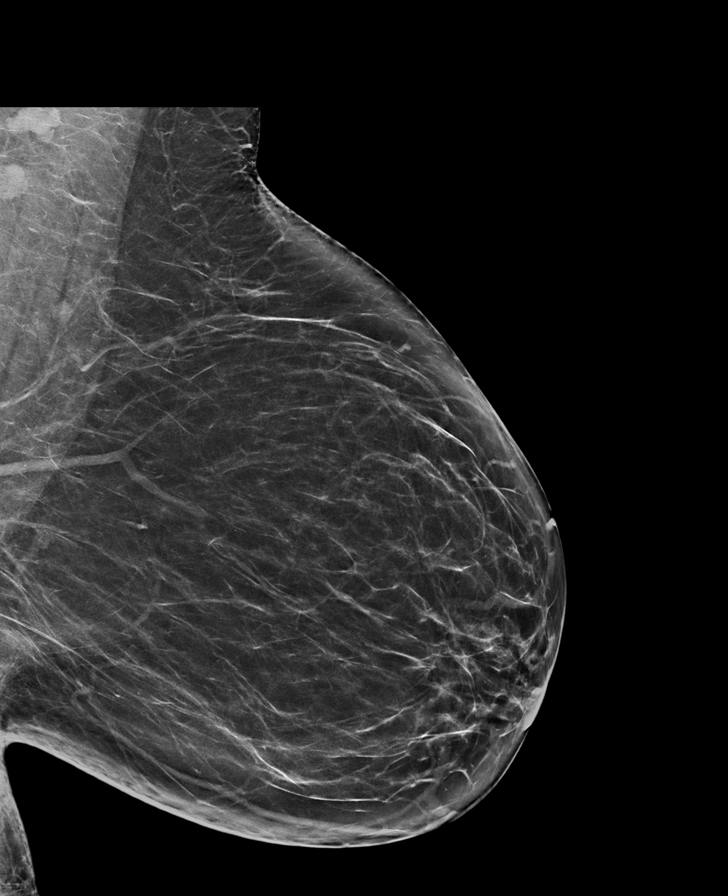

[L CC synth-2D]
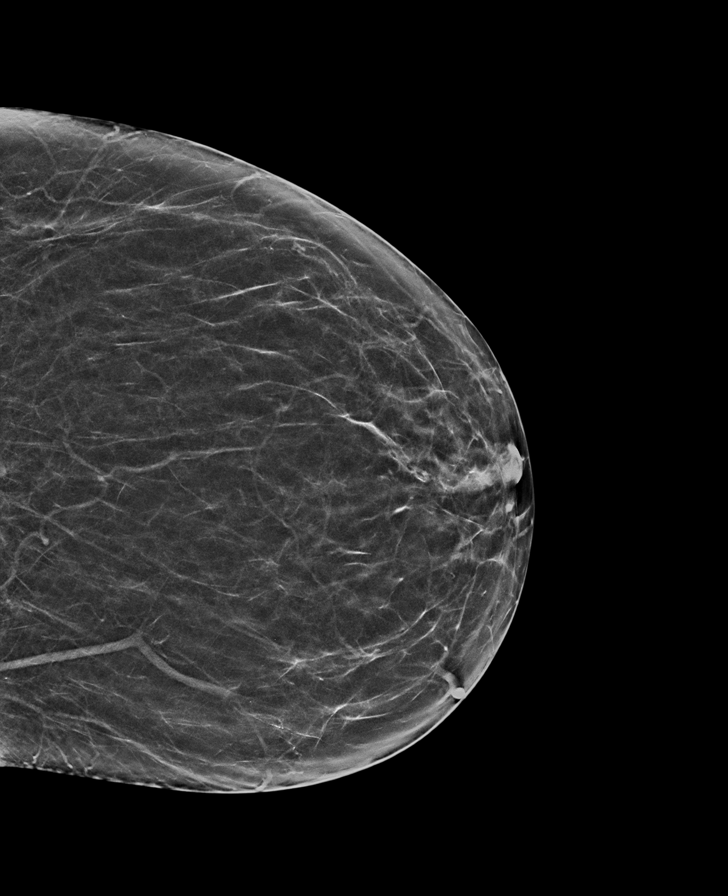

[R MLO synth-2D]
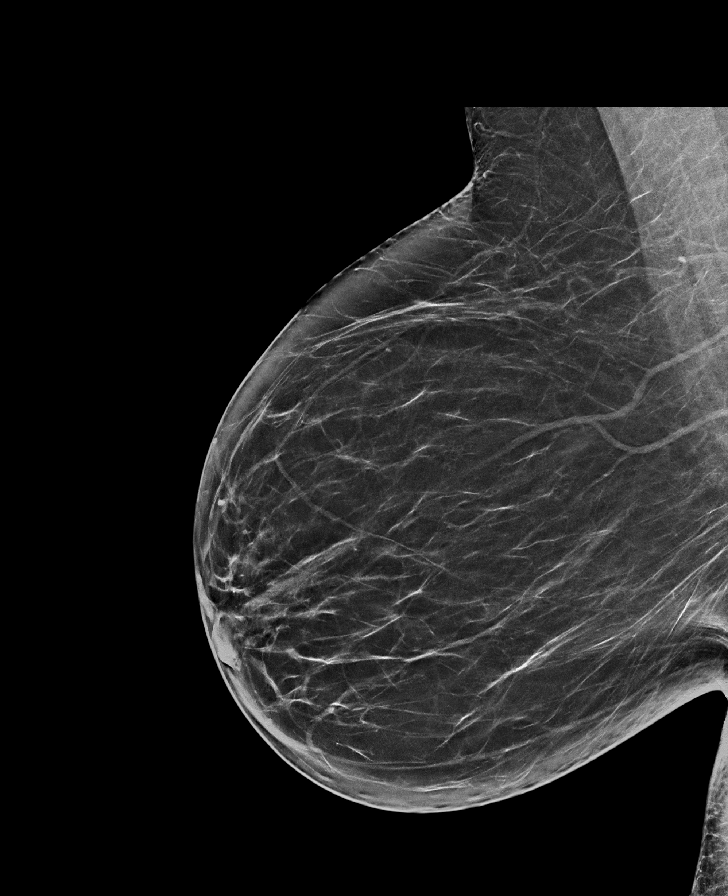

[R CC synth-2D]
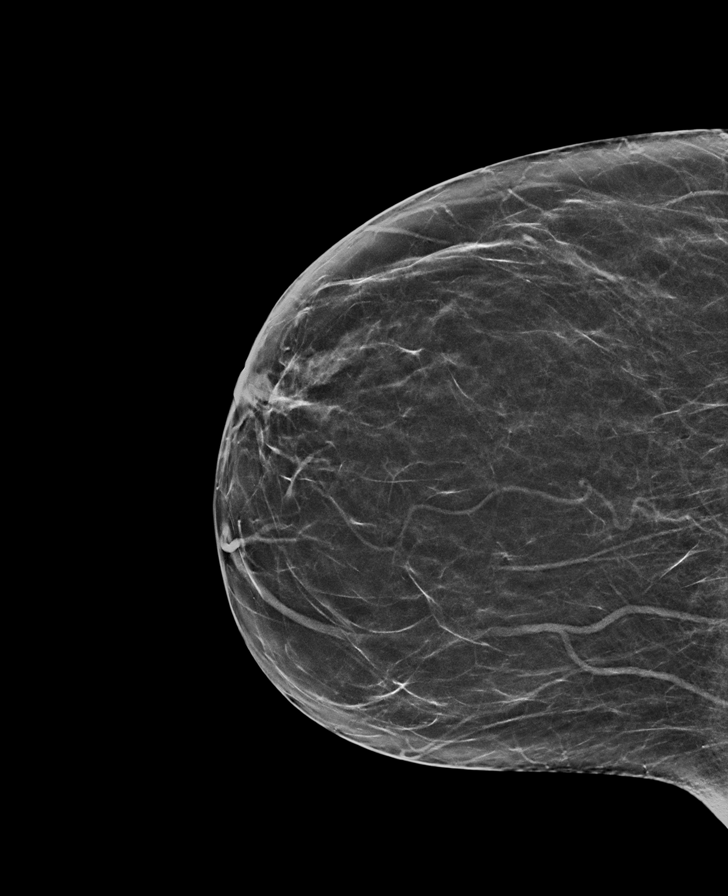

[L CC tomo · tomo slice 31/61.0]
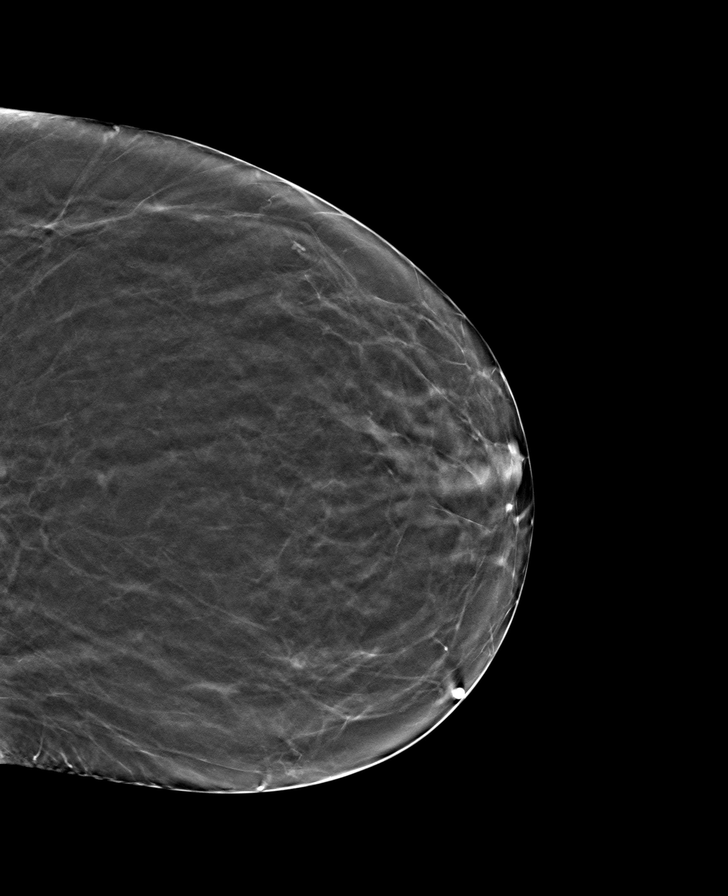

[R CC tomo · tomo slice 32/63.0]
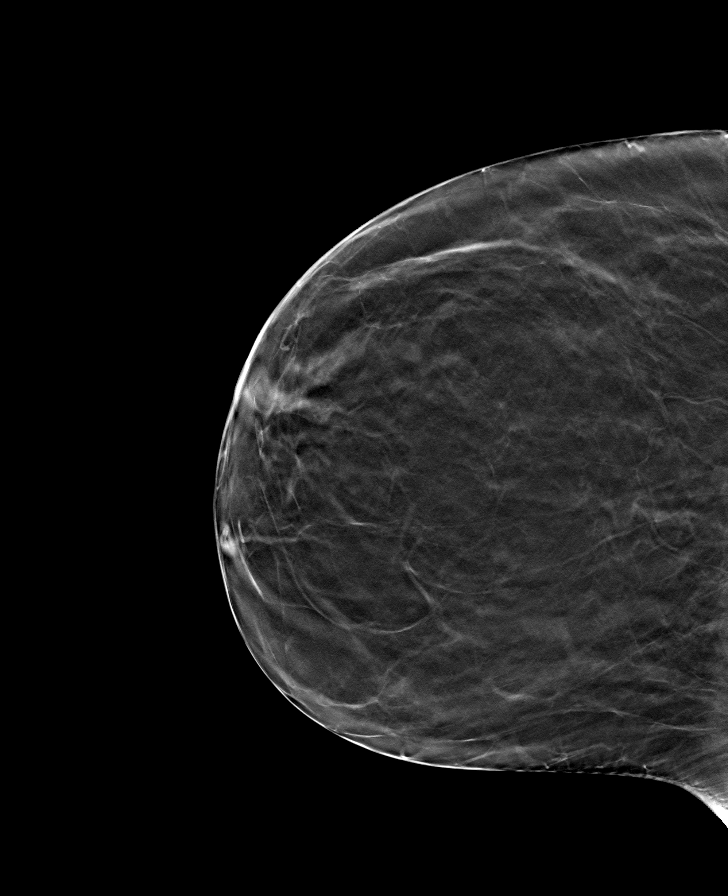

[L MLO tomo · tomo slice 39/76.0]
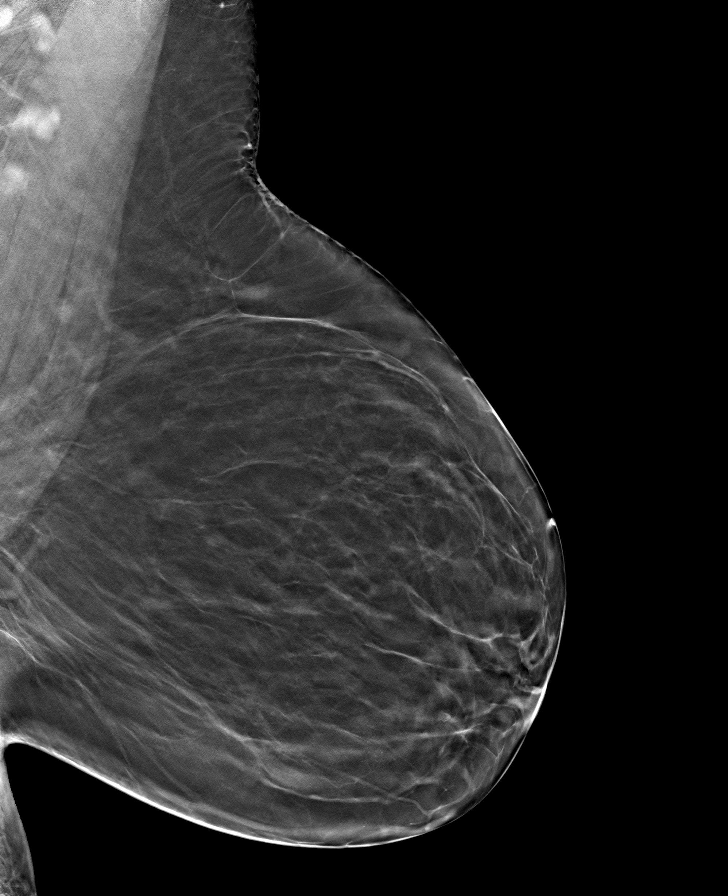

[R MLO tomo · tomo slice 38/75.0]
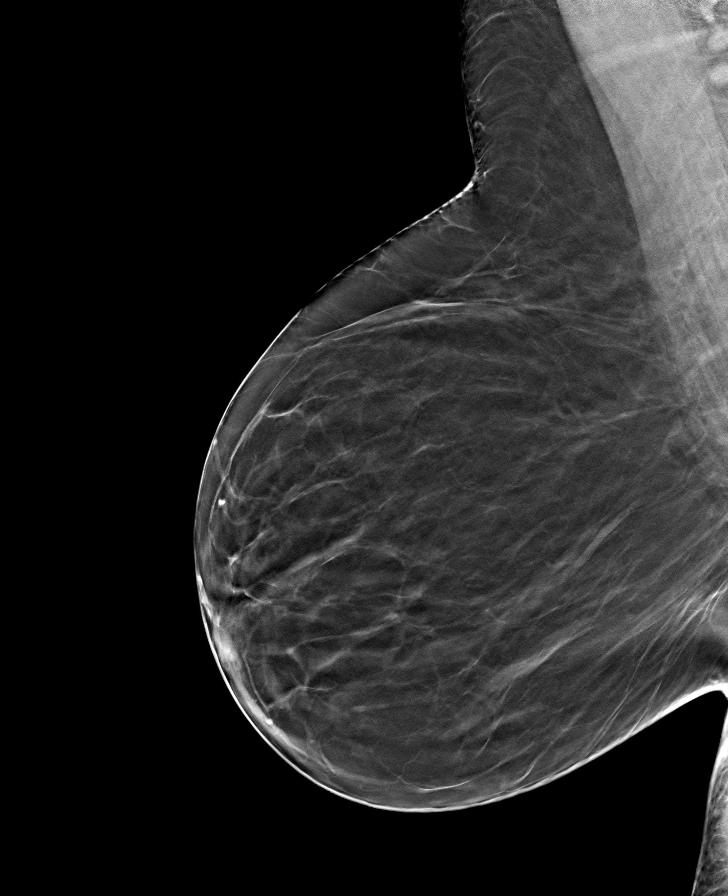

[8 of 24 positions shown; findings below may reference images not displayed]

FINDINGS: There are no findings suspicious for malignancy. Images were
processed with CAD.
IMPRESSION: No mammographic evidence of malignancy. A result letter of this
screening mammogram will be mailed directly to the patient.

RECOMMENDATION:
Screening mammogram in one year. (Code:8Y-[id number])

BI-RADS CATEGORY  1: Negative.

## 2021-02-27 ENCOUNTER — Ambulatory Visit
Admission: RE | Admit: 2021-02-27 | Discharge: 2021-02-27 | Disposition: A | Discharge status: Home / Self Care | Payer: BC Managed Care – PPO | Source: Ambulatory Visit | Attending: Internal Medicine | Admitting: Internal Medicine

## 2021-02-27 ENCOUNTER — Other Ambulatory Visit: Payer: Self-pay

## 2021-02-27 ENCOUNTER — Other Ambulatory Visit: Payer: Self-pay | Admitting: Internal Medicine

## 2021-02-27 DIAGNOSIS — E782 Mixed hyperlipidemia: Secondary | ICD-10-CM

## 2021-11-22 ENCOUNTER — Other Ambulatory Visit: Payer: Self-pay | Admitting: Family Medicine

## 2021-11-22 DIAGNOSIS — Z1231 Encounter for screening mammogram for malignant neoplasm of breast: Secondary | ICD-10-CM

## 2021-11-29 ENCOUNTER — Ambulatory Visit
Admission: RE | Admit: 2021-11-29 | Discharge: 2021-11-29 | Disposition: A | Discharge status: Home / Self Care | Payer: BC Managed Care – PPO | Source: Ambulatory Visit | Attending: Family Medicine | Admitting: Family Medicine

## 2021-11-29 DIAGNOSIS — Z1231 Encounter for screening mammogram for malignant neoplasm of breast: Secondary | ICD-10-CM | POA: Insufficient documentation

## 2023-01-27 ENCOUNTER — Other Ambulatory Visit: Payer: Self-pay | Admitting: Family Medicine

## 2023-01-27 DIAGNOSIS — Z1231 Encounter for screening mammogram for malignant neoplasm of breast: Secondary | ICD-10-CM

## 2023-02-05 ENCOUNTER — Ambulatory Visit
Admission: RE | Admit: 2023-02-05 | Discharge: 2023-02-05 | Disposition: A | Discharge status: Home / Self Care | Payer: BC Managed Care – PPO | Source: Ambulatory Visit | Attending: Family Medicine | Admitting: Family Medicine

## 2023-02-05 DIAGNOSIS — Z1231 Encounter for screening mammogram for malignant neoplasm of breast: Secondary | ICD-10-CM | POA: Diagnosis present

## 2023-05-10 IMAGING — CT CT CARDIAC CORONARY ARTERY CALCIUM SCORE
3 series · 14 of 20 positions shown, 16 images · non-contrast
Comparison: None.

Addendum:
CLINICAL DATA: Risk stratification

EXAM:
Coronary Calcium Score
TECHNIQUE: The patient was scanned on a Siemens Somatom go.Top Scanner. Axial
non-contrast 3 mm slices were carried out through the heart. The
data set was analyzed on a dedicated work station and scored using
the Agatson method.

[Series 2: sa36 calcium scoring 3.00 · axial · 0.32mm/px · z∈[-1090,-1009]mm · 4 of 46 slices shown]
[im 10/46  vessel]
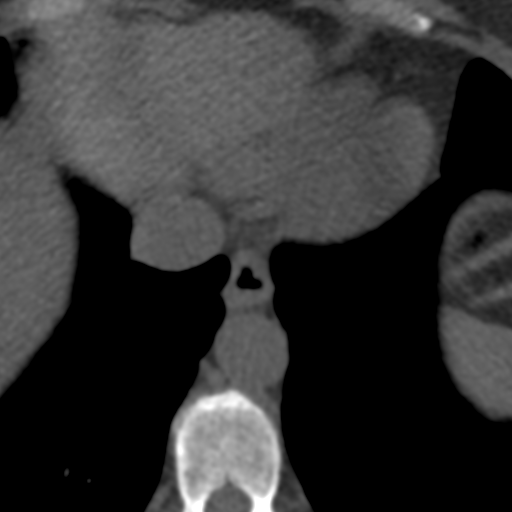
[im 19/46  vessel]
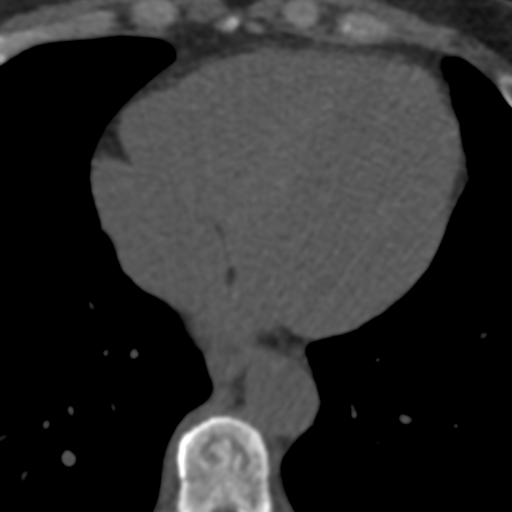
[im 28/46  vessel]
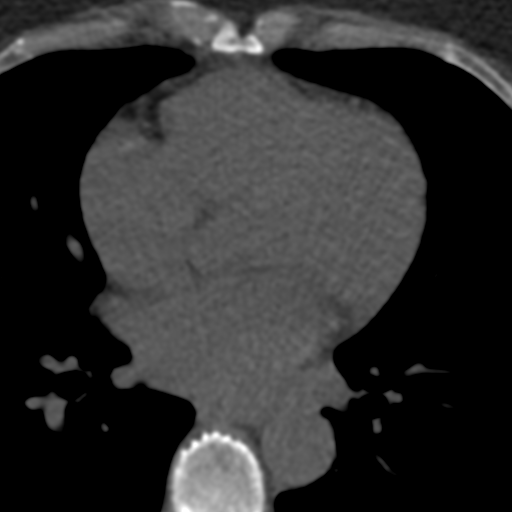
[im 37/46  vessel]
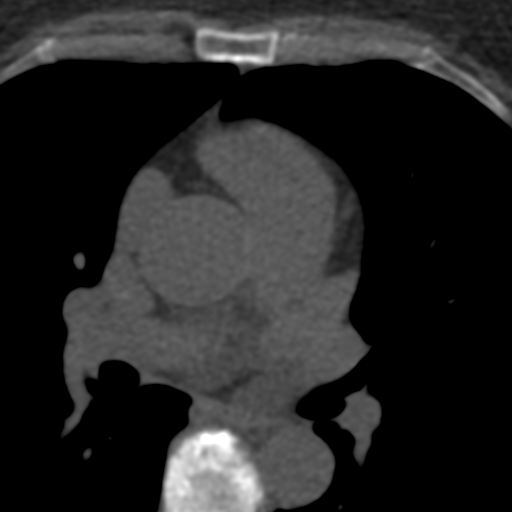

[Series 5: full fov st calcium scoring 3.00 · axial · 0.62mm/px · z∈[-1096,-1006]mm · 5 of 46 slices shown, 7 images]
[im 8/46  vessel]
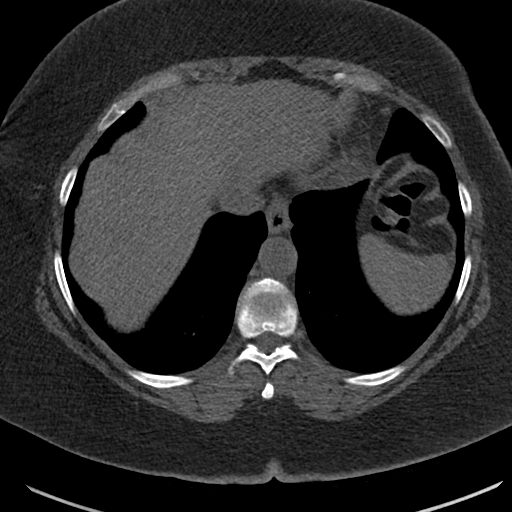
[im 8/46  lung]
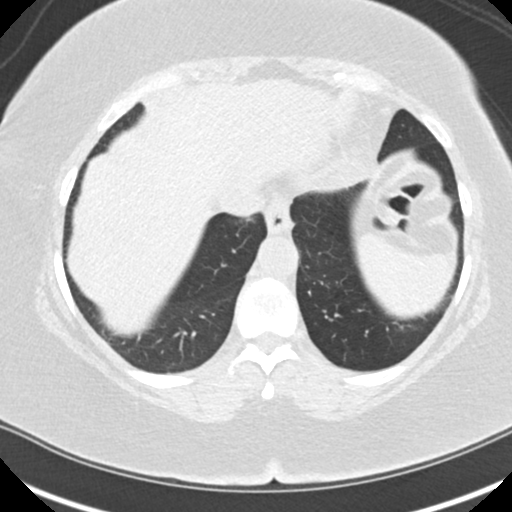
[im 16/46  vessel]
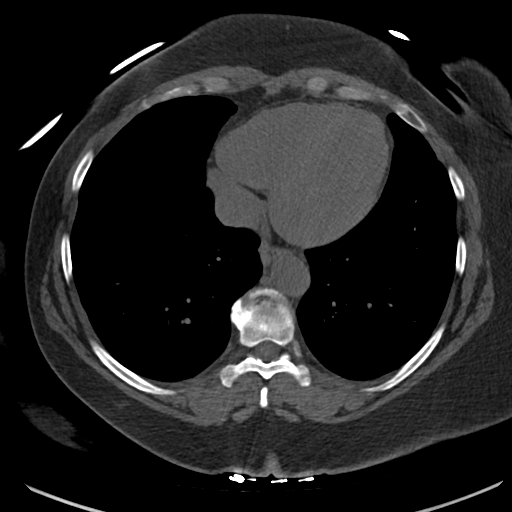
[im 23/46  vessel]
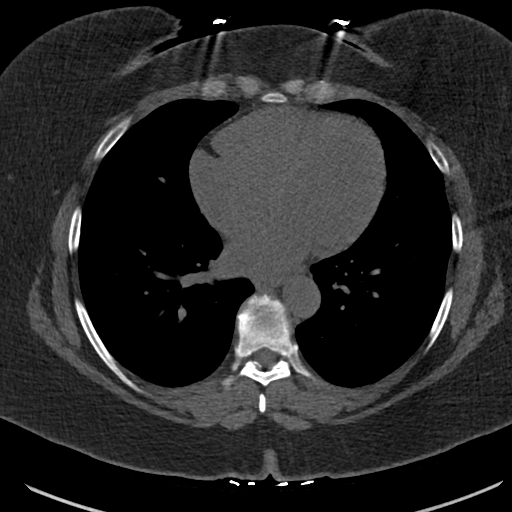
[im 31/46  vessel]
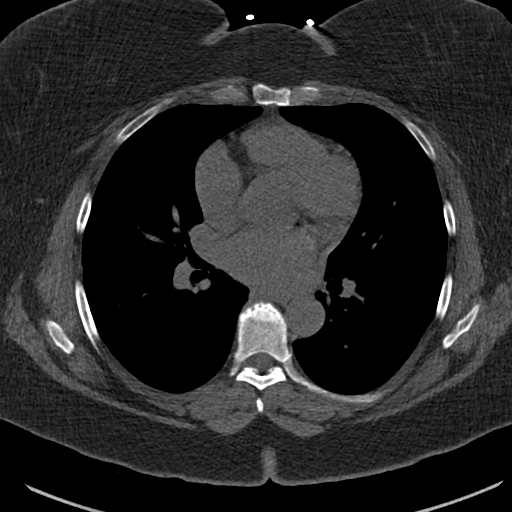
[im 38/46  vessel]
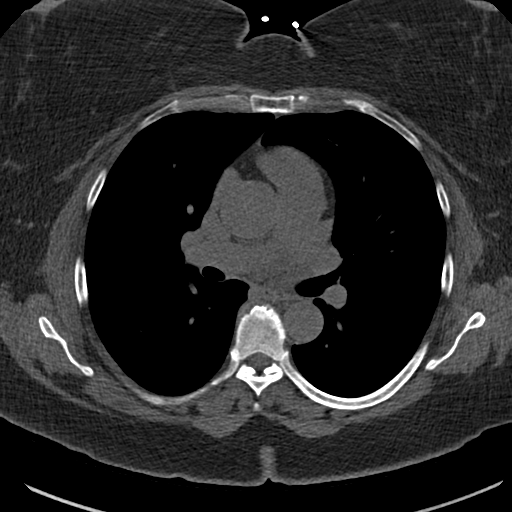
[im 38/46  lung]
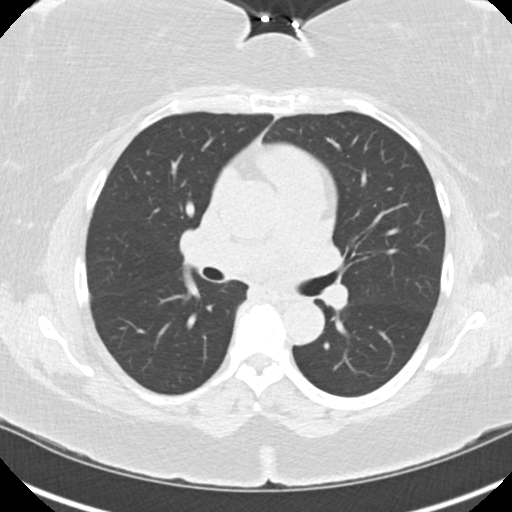

[Series 10: full fov lungs calcium scoring 3.00 ax · axial · 0.62mm/px · z∈[-1096,-1006]mm · 5 of 46 slices shown]
[im 8/46  vessel]
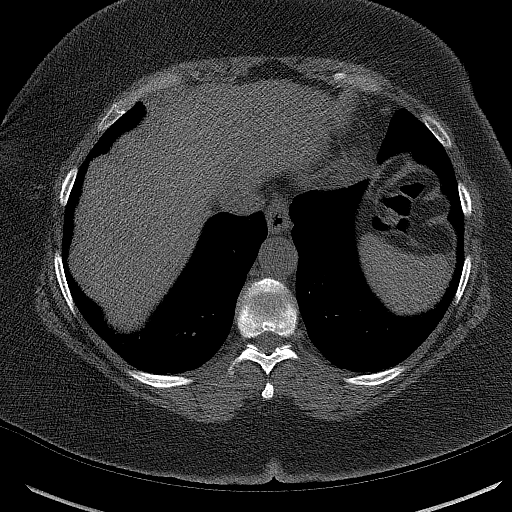
[im 16/46  vessel]
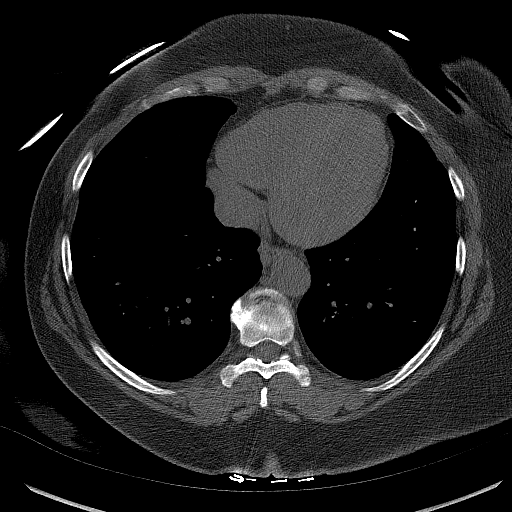
[im 23/46  vessel]
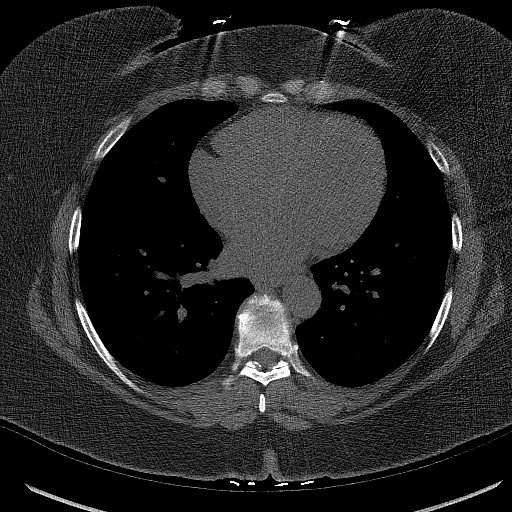
[im 31/46  vessel]
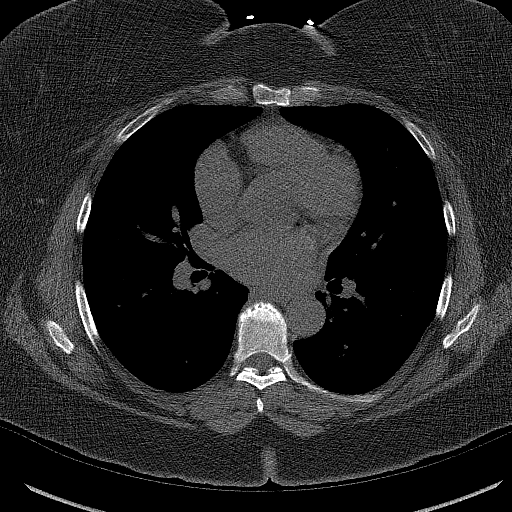
[im 38/46  vessel]
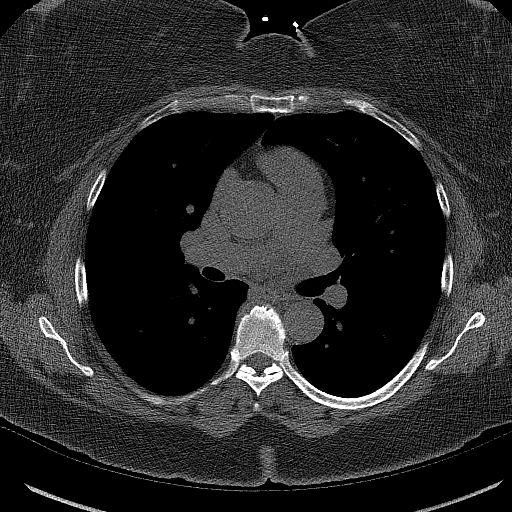

[14 of 20 positions shown; findings below may reference images not displayed]

FINDINGS: Non-cardiac: See separate report from [REDACTED].

Ascending Aorta: Normal size

Pericardium: Normal

Coronary arteries: Normal origin of left and right coronary
arteries. Distribution of arterial calcifications if present, as
noted below;

LM 0

LAD 0

LCx 0

RCA 0

Total 0

IMPRESSION AND RECOMMENDATION:
1. Normal coronary calcium score of 0. Patient is low risk for
coronary events.

2. CAC 0, KLPIGBB MOOLMAN0

3. Continue heart healthy lifestyle and risk factor modification.

Ulbe Demiquel

EXAM:
OVER-READ INTERPRETATION  CT CHEST

The following report is an over-read performed by radiologist Dr.
over-read does not include interpretation of cardiac or coronary
anatomy or pathology. The calcium score interpretation by the
cardiologist is attached.
FINDINGS: Limited view of the lung parenchyma demonstrates no suspicious
nodularity. Airways are normal.

Limited view of the mediastinum demonstrates no adenopathy.
Esophagus normal.

Limited view of the upper abdomen unremarkable. Post bariatric
surgery

Limited view of the skeleton and chest wall is unremarkable.
IMPRESSION: Significant extracardiac findings

*** End of Addendum ***
FINDINGS: Non-cardiac: See separate report from [REDACTED].

Ascending Aorta: Normal size

Pericardium: Normal

Coronary arteries: Normal origin of left and right coronary
arteries. Distribution of arterial calcifications if present, as
noted below;

LM 0

LAD 0

LCx 0

RCA 0

Total 0

IMPRESSION AND RECOMMENDATION:
1. Normal coronary calcium score of 0. Patient is low risk for
coronary events.

2. CAC 0, KLPIGBB MOOLMAN0

3. Continue heart healthy lifestyle and risk factor modification.

Ulbe Demiquel

## 2023-09-15 ENCOUNTER — Telehealth: Payer: Self-pay | Admitting: Orthopaedic Surgery

## 2023-09-15 ENCOUNTER — Other Ambulatory Visit (INDEPENDENT_AMBULATORY_CARE_PROVIDER_SITE_OTHER)

## 2023-09-15 ENCOUNTER — Encounter: Payer: Self-pay | Admitting: Orthopaedic Surgery

## 2023-09-15 ENCOUNTER — Ambulatory Visit: Payer: Self-pay | Admitting: Orthopaedic Surgery

## 2023-09-15 DIAGNOSIS — G8929 Other chronic pain: Secondary | ICD-10-CM

## 2023-09-15 DIAGNOSIS — M25561 Pain in right knee: Secondary | ICD-10-CM

## 2023-09-15 MED ORDER — LIDOCAINE HCL 1 % IJ SOLN
2.0000 mL | INTRAMUSCULAR | Status: AC | PRN
  Administered 2023-09-15: 2 mL

## 2023-09-15 MED ORDER — MELOXICAM 7.5 MG PO TABS: 7.5000 mg | ORAL_TABLET | Freq: Two times a day (BID) | ORAL | 2 refills | Status: DC | PRN

## 2023-09-15 MED ORDER — METHYLPREDNISOLONE ACETATE 40 MG/ML IJ SUSP
40.0000 mg | INTRAMUSCULAR | Status: AC | PRN
  Administered 2023-09-15: 40 mg via INTRA_ARTICULAR

## 2023-09-15 MED ORDER — BUPIVACAINE HCL 0.5 % IJ SOLN
2.0000 mL | INTRAMUSCULAR | Status: AC | PRN
  Administered 2023-09-15: 2 mL via INTRA_ARTICULAR

## 2023-09-15 NOTE — Progress Notes (Signed)
 Office Visit Note   Patient: Tracie Flores           Date of Birth: May 10, 1961           MRN: [id number] Visit Date: 09/15/2023              Requested by: Rory Collard, MD [phone] [place],  [place] [address] PCP: Rory Collard, MD   Assessment & Plan: Visit Diagnoses:  1. Chronic pain of right knee     Plan: Dhara is a 63 year old female with right knee valgus DJD.  Probable flareup.  She would like to try another cortisone injection today.  Will see her back as needed.  Meloxicam  prescribed to use sparingly.  Follow-Up Instructions: No follow-ups on file.   Orders:  Orders Placed This Encounter  Procedures   Large Joint Inj   XR KNEE 3 VIEW RIGHT   Meds ordered this encounter  Medications   meloxicam  (MOBIC ) 7.5 MG tablet    Sig: Take 1 tablet (7.5 mg total) by mouth 2 (two) times daily as needed for pain.    Dispense:  30 tablet    Refill:  2      Procedures: Large Joint Inj: R knee on 09/15/2023 12:31 PM Indications: pain Details: 22 G needle  Arthrogram: No  Medications: 40 mg methylPREDNISolone  acetate 40 MG/ML; 2 mL lidocaine  1 %; 2 mL bupivacaine  0.5 % Consent was given by the patient. Patient was prepped and draped in the usual sterile fashion.       Clinical Data: No additional findings.   Subjective: Chief Complaint  Patient presents with   Right Knee - Pain    HPI Tracie Flores is a 63 year old patient comes in for evaluation of right knee pain.  She has been symptomatic for about 4 weeks.  She is a Lawyer.  I saw her about 4 to 5 years ago and drew fluid off her knee and did a cortisone injection.  She did well from this until recently.  Denies any recent trauma.  She has been limiting her activities based on her symptoms. Review of Systems  Constitutional: Negative.   HENT: Negative.    Eyes: Negative.   Respiratory: Negative.    Cardiovascular: Negative.   Endocrine: Negative.   Musculoskeletal: Negative.    Neurological: Negative.   Hematological: Negative.   Psychiatric/Behavioral: Negative.    All other systems reviewed and are negative.    Objective: Vital Signs: There were no vitals taken for this visit.  Physical Exam Vitals and nursing note reviewed.  Constitutional:      Appearance: She is well-developed.  HENT:     Head: Atraumatic.     Nose: Nose normal.  Eyes:     Extraocular Movements: Extraocular movements intact.  Cardiovascular:     Pulses: Normal pulses.  Pulmonary:     Effort: Pulmonary effort is normal.  Abdominal:     Palpations: Abdomen is soft.  Musculoskeletal:     Cervical back: Neck supple.  Skin:    General: Skin is warm.     Capillary Refill: Capillary refill takes less than 2 seconds.  Neurological:     Mental Status: She is alert. Mental status is at baseline.  Psychiatric:        Behavior: Behavior normal.        Thought Content: Thought content normal.        Judgment: Judgment normal.     Ortho Exam Exam of the right knee shows  mild valgus alignment.  Range of motion is at baseline.  Cause increases are stable.  Trace effusion. Specialty Comments:  No specialty comments available.  Imaging: XR KNEE 3 VIEW RIGHT Result Date: 09/15/2023 X-rays of the right knee show mild valgus alignment with moderate DJD    PMFS History: Patient Active Problem List   Diagnosis Date Noted   Primary osteoarthritis of right knee 08/12/2019   Past Medical History:  Diagnosis Date   Hypothyroidism     Family History  Problem Relation Age of Onset   Breast cancer Mother 19       diagnosed in 2016    Past Surgical History:  Procedure Laterality Date   ablasion     COLONOSCOPY N/A 03/14/2016   Procedure: COLONOSCOPY;  Surgeon: Deveron Fly, MD;  Location: [facility] ENDOSCOPY;  Service: Endoscopy;  Laterality: N/A;   LAPAROSCOPIC GASTRIC SLEEVE RESECTION     TONSILLECTOMY     Social History   Occupational History   Not on file  Tobacco Use    Smoking status: Never   Smokeless tobacco: Never  Substance and Sexual Activity   Alcohol use: Not on file   Drug use: Not on file   Sexual activity: Not on file

## 2023-09-15 NOTE — Telephone Encounter (Signed)
 Patient called and said that Dr. Christiane Cowing wrote a prescription but it needs to be changed to take one a day instead the pharmacist said. CB#[phone]

## 2023-09-15 NOTE — Telephone Encounter (Signed)
 That is fine I do not have to write a new prescription.  She can just like to take it once a day.

## 2023-09-16 MED ORDER — MELOXICAM 7.5 MG PO TABS: 7.5000 mg | ORAL_TABLET | Freq: Every day | ORAL | 2 refills | Status: AC

## 2023-09-16 NOTE — Addendum Note (Signed)
 Addended by: Sidonie Drape on: 09/16/2023 12:58 PM   Modules accepted: Orders

## 2023-09-16 NOTE — Telephone Encounter (Signed)
 done

## 2023-09-16 NOTE — Telephone Encounter (Signed)
 She needs it resubmitted for once daily due to insurance purposes. Otherwise it is way more expensive if written for twice daily.

## 2024-02-10 ENCOUNTER — Other Ambulatory Visit: Payer: Self-pay | Admitting: Certified Nurse Midwife

## 2024-02-10 ENCOUNTER — Ambulatory Visit
Admission: RE | Admit: 2024-02-10 | Discharge: 2024-02-10 | Disposition: A | Discharge status: Home / Self Care | Source: Ambulatory Visit | Attending: Certified Nurse Midwife | Admitting: Certified Nurse Midwife

## 2024-02-10 DIAGNOSIS — Z1231 Encounter for screening mammogram for malignant neoplasm of breast: Secondary | ICD-10-CM

## 2024-03-24 ENCOUNTER — Ambulatory Visit: Admitting: Orthopaedic Surgery

## 2024-03-24 DIAGNOSIS — M1711 Unilateral primary osteoarthritis, right knee: Secondary | ICD-10-CM | POA: Diagnosis not present

## 2024-03-24 MED ORDER — LIDOCAINE HCL 1 % IJ SOLN
2.0000 mL | INTRAMUSCULAR | Status: AC | PRN
  Administered 2024-03-24: 2 mL

## 2024-03-24 MED ORDER — BUPIVACAINE HCL 0.5 % IJ SOLN
2.0000 mL | INTRAMUSCULAR | Status: AC | PRN
  Administered 2024-03-24: 2 mL via INTRA_ARTICULAR

## 2024-03-24 MED ORDER — METHYLPREDNISOLONE ACETATE 40 MG/ML IJ SUSP
40.0000 mg | INTRAMUSCULAR | Status: AC | PRN
  Administered 2024-03-24: 40 mg via INTRA_ARTICULAR

## 2024-03-24 NOTE — Progress Notes (Signed)
 Office Visit Note   Patient: Tracie Flores           Date of Birth: 13-Jan-1961           MRN: [id number] Visit Date: 03/24/2024              Requested by: Glover Lenis, MD [phone] [place],  [place] [address] PCP: Glover Lenis, MD   Assessment & Plan: Visit Diagnoses:  1. Primary osteoarthritis of right knee     Plan: History of Present Illness Tracie Flores is a 63 year old female who presents with worsening knee pain.  She experiences localized pain on the medial side of her knee, initially triggered during exercise and exacerbated by pressure. The pain intensifies when ascending or descending stairs and when sitting, but is less noticeable during walking. Small movements, such as lifting her leg, are challenging.  The pain is aggravated by knee bending, particularly on stairs. The current pain is on the medial side and around the kneecap. The area is tender to touch, and she experiences crepitus.  She is concerned about the impact of the pain on her mobility and fears falling.  Physical Exam MUSCULOSKELETAL: Crepitus and pain on the medial side of the patella retinaculum.  No joint effusion.  Slight medial joint line tenderness.  Collaterals and cruciates are stable.  Assessment and Plan Right knee osteoarthritis with valgus deformity Chronic osteoarthritis with valgus deformity. Not amenable to surgical correction without knee replacement. - Administer cortisone injection to reduce inflammation. - Consider physical therapy to strengthen VMO. - Provide osteoarthritis knee brace  Right knee pain secondary to osteoarthritis Pain due to worn cartilage and bone spur, not fully alleviated by previous treatments. Significant concern due to fear of falling. Knee replacement may be necessary, timing dependent on her decision. - Administer cortisone injection to reduce inflammation and pain. - Consider physical therapy to improve function and reduce pain. - Provide  osteoarthritis knee brace for pain relief and stability.  Follow-Up Instructions: No follow-ups on file.   Orders:  Orders Placed This Encounter  Procedures   Large Joint Inj: R knee   Ambulatory referral to Physical Therapy   No orders of the defined types were placed in this encounter.     Procedures: Large Joint Inj: R knee on 03/24/2024 8:50 AM Indications: pain Details: 22 G needle  Arthrogram: No  Medications: 40 mg methylPREDNISolone  acetate 40 MG/ML; 2 mL lidocaine  1 %; 2 mL bupivacaine  0.5 % Consent was given by the patient. Patient was prepped and draped in the usual sterile fashion.       Clinical Data: No additional findings.   Subjective: Chief Complaint  Patient presents with   Right Knee - Pain    HPI  Review of Systems   Objective: Vital Signs: There were no vitals taken for this visit.  Physical Exam  Ortho Exam  Specialty Comments:  No specialty comments available.  Imaging: No results found.   PMFS History: Patient Active Problem List   Diagnosis Date Noted   Primary osteoarthritis of right knee 08/12/2019   Past Medical History:  Diagnosis Date   Hypothyroidism     Family History  Problem Relation Age of Onset   Breast cancer Mother 76       diagnosed in 2016    Past Surgical History:  Procedure Laterality Date   ablasion     COLONOSCOPY N/A 03/14/2016   Procedure: COLONOSCOPY;  Surgeon: Gladis RAYMOND Mariner, MD;  Location: [facility] ENDOSCOPY;  Service: Endoscopy;  Laterality: N/A;   LAPAROSCOPIC GASTRIC SLEEVE RESECTION     TONSILLECTOMY     Social History   Occupational History   Not on file  Tobacco Use   Smoking status: Never   Smokeless tobacco: Never  Substance and Sexual Activity   Alcohol use: Not on file   Drug use: Not on file   Sexual activity: Not on file

## 2024-03-28 ENCOUNTER — Encounter: Payer: Self-pay | Admitting: Radiology

## 2024-03-30 ENCOUNTER — Ambulatory Visit: Admitting: Physical Therapy

## 2024-04-06 ENCOUNTER — Ambulatory Visit: Admitting: Physical Therapy

## 2024-04-12 ENCOUNTER — Encounter: Payer: Self-pay | Admitting: Orthopaedic Surgery

## 2024-04-19 ENCOUNTER — Ambulatory Visit: Admitting: Physical Therapy

## 2024-04-27 ENCOUNTER — Ambulatory Visit: Admitting: Physical Therapy

## 2024-05-03 ENCOUNTER — Ambulatory Visit: Admitting: Orthopaedic Surgery

## 2024-05-04 ENCOUNTER — Ambulatory Visit: Admitting: Physical Therapy

## 2024-05-11 ENCOUNTER — Ambulatory Visit: Admitting: Physical Therapy

## 2024-05-11 ENCOUNTER — Ambulatory Visit: Admitting: Orthopaedic Surgery

## 2024-05-11 ENCOUNTER — Other Ambulatory Visit (INDEPENDENT_AMBULATORY_CARE_PROVIDER_SITE_OTHER)

## 2024-05-11 DIAGNOSIS — M1711 Unilateral primary osteoarthritis, right knee: Secondary | ICD-10-CM

## 2024-05-11 NOTE — Progress Notes (Signed)
 Office Visit Note   Patient: Tracie Flores           Date of Birth: 06-10-1960           MRN: [id number] Visit Date: 05/11/2024              Requested by: Glover Lenis, MD [phone] [place],  [place] [address] PCP: Glover Lenis, MD   Assessment & Plan: Visit Diagnoses:  1. Primary osteoarthritis of right knee     Plan: History of Present Illness Tracie Flores is a 63 year old female with right knee arthritis who presents with persistent right knee pain.  She has persistent sharp medial right knee pain that has not improved since her cortisone injection on March 24, 2024, whereas prior injections had helped. The pain is worst with pressure on the knee, especially when rising from the floor. She felt a sharp pain twice within a few days, with a sensation that something broke or popped out of place, and the knee has felt abnormal since. The pain is now constant and limits walking, sitting, and climbing stairs. She denies clicking, catching, or locking. The knee often swells, and she feels a prominent area at the site of pain. She has never had an MRI of this knee. X-rays from September 15, 2023, were similar to current imaging without significant change.  Physical Exam MUSCULOSKELETAL: Right knee effusion.  Functional range of motion.  Medial joint line tenderness.  Pain with McMurray maneuver.  Results Radiology Right knee x-ray (05/11/2024): No significant interval change compared to 09/15/2023; findings consistent with osteoarthritis (Independently interpreted) Right knee x-ray (09/15/2023): Findings consistent with osteoarthritis (Independently interpreted)  Assessment and Plan Primary osteoarthritis of right knee Persistent medial knee pain unrelieved by cortisone injection. Possible meniscal tear or cartilage damage. X-rays show significant arthritis. - Ordered MRI of right knee to assess for meniscal tear, cartilage damage, and extent of arthritis. - Coordinated  MRI scheduling through [facility]. - Will discuss MRI results to determine further treatment options.  Follow-Up Instructions: No follow-ups on file.   Orders:  Orders Placed This Encounter  Procedures   XR KNEE 3 VIEW RIGHT   MR Knee Right w/o contrast   No orders of the defined types were placed in this encounter.     Procedures: No procedures performed   Clinical Data: No additional findings.   Subjective: Chief Complaint  Patient presents with   Right Knee - Pain    HPI  Review of Systems  Constitutional: Negative.   HENT: Negative.    Eyes: Negative.   Respiratory: Negative.    Cardiovascular: Negative.   Endocrine: Negative.   Musculoskeletal: Negative.   Neurological: Negative.   Hematological: Negative.   Psychiatric/Behavioral: Negative.    All other systems reviewed and are negative.    Objective: Vital Signs: There were no vitals taken for this visit.  Physical Exam Vitals and nursing note reviewed.  Constitutional:      Appearance: She is well-developed.  HENT:     Head: Atraumatic.     Nose: Nose normal.  Eyes:     Extraocular Movements: Extraocular movements intact.  Cardiovascular:     Pulses: Normal pulses.  Pulmonary:     Effort: Pulmonary effort is normal.  Abdominal:     Palpations: Abdomen is soft.  Musculoskeletal:     Cervical back: Neck supple.  Skin:    General: Skin is warm.     Capillary Refill: Capillary refill takes  less than 2 seconds.  Neurological:     Mental Status: She is alert. Mental status is at baseline.  Psychiatric:        Behavior: Behavior normal.        Thought Content: Thought content normal.        Judgment: Judgment normal.     Ortho Exam  Specialty Comments:  No specialty comments available.  Imaging: XR KNEE 3 VIEW RIGHT Result Date: 05/11/2024 X-rays of the right knee show moderately severe tricompartmental osteoarthritis with mild valgus deformity.    PMFS  History: Patient Active Problem List   Diagnosis Date Noted   Primary osteoarthritis of right knee 08/12/2019   Past Medical History:  Diagnosis Date   Hypothyroidism     Family History  Problem Relation Age of Onset   Breast cancer Mother 5       diagnosed in 2016    Past Surgical History:  Procedure Laterality Date   ablasion     COLONOSCOPY N/A 03/14/2016   Procedure: COLONOSCOPY;  Surgeon: Gladis RAYMOND Mariner, MD;  Location: [facility] ENDOSCOPY;  Service: Endoscopy;  Laterality: N/A;   LAPAROSCOPIC GASTRIC SLEEVE RESECTION     TONSILLECTOMY     Social History   Occupational History   Not on file  Tobacco Use   Smoking status: Never   Smokeless tobacco: Never  Substance and Sexual Activity   Alcohol use: Not on file   Drug use: Not on file   Sexual activity: Not on file

## 2024-05-16 ENCOUNTER — Ambulatory Visit: Admitting: Physical Therapy

## 2024-05-23 ENCOUNTER — Ambulatory Visit: Admitting: Physical Therapy

## 2024-05-24 ENCOUNTER — Ambulatory Visit: Admitting: Physical Therapy

## 2024-05-27 ENCOUNTER — Ambulatory Visit
Admission: RE | Admit: 2024-05-27 | Discharge: 2024-05-27 | Disposition: A | Discharge status: Home / Self Care | Source: Ambulatory Visit | Attending: Orthopaedic Surgery | Admitting: Orthopaedic Surgery

## 2024-05-27 DIAGNOSIS — M1711 Unilateral primary osteoarthritis, right knee: Secondary | ICD-10-CM

## 2024-05-30 ENCOUNTER — Other Ambulatory Visit

## 2024-05-30 ENCOUNTER — Ambulatory Visit: Admitting: Physical Therapy

## 2024-06-07 ENCOUNTER — Ambulatory Visit: Payer: Self-pay | Admitting: Orthopaedic Surgery

## 2024-06-07 NOTE — Progress Notes (Signed)
 Needs follow up appointment.

## 2024-06-08 NOTE — Progress Notes (Signed)
 Tried to call. Call couldn't be completed at this time. Will try again later.

## 2024-06-09 NOTE — Progress Notes (Signed)
 Called and was able to reach patient. Scheduled.

## 2024-06-21 ENCOUNTER — Ambulatory Visit: Admitting: Orthopaedic Surgery

## 2024-06-21 DIAGNOSIS — M1711 Unilateral primary osteoarthritis, right knee: Secondary | ICD-10-CM

## 2024-06-21 NOTE — Progress Notes (Signed)
 "  Office Visit Note   Patient: Tracie Flores           Date of Birth: 10-02-1960           MRN: [id number] Visit Date: 06/21/2024              Requested by: Glover Lenis, MD [phone] [address] [place],  [place] [address] PCP: Glover Lenis, MD   Assessment & Plan: Visit Diagnoses:  1. Primary osteoarthritis of right knee     Plan: Impression is advanced tricompartmental osteoarthritis with bone-on-bone joint space narrowing of patellofemoral compartment and near bone-on-bone joint space narrowing of the femoral-tibial compartments.  MRI of the right knee confirms these findings.  Treatment options discussed again.  At this point she has elected to move forward with a right total knee arthroplasty.  She would like to have this done in early June.  [name] to reach out to her to confirm surgery date.  Will place order for PT for 6 weeks.  Hopefully this will help strengthen her knee prior to surgery.    Impression is severe right knee degenerative joint disease secondary to Osteoarthritis.  Patient has attempted conservative treatment for at least 6 consecutive weeks within the past 12 weeks, including but not limited to physical therapy, home exercise program, NSAIDs, activity modification, and/or corticosteroid injections. Despite these efforts, symptoms have not improved or have worsened. Conservative measures have been deemed unsuccessful at this time. After a detailed discussion covering diagnosis and treatment options--including the risks, benefits, alternatives, and potential complications of surgical and nonsurgical management--the patient elected to proceed with surgery  Anticoagulants: No antithrombotic Postop anticoagulation: Eliquis Diabetic: No  Nickel allergy: No Prior DVT/PE: No Tobacco use: No Clearances needed for surgery: PCP Anticipated discharge dispo: Home   Follow-Up Instructions: No follow-ups on file.   Orders:  No orders of the defined types were placed  in this encounter.  No orders of the defined types were placed in this encounter.     Procedures: No procedures performed   Clinical Data: No additional findings.   Subjective: Chief Complaint  Patient presents with   Right Knee - Follow-up    HPI Tracie Flores returns today for MRI review and follow-up evaluation of right knee pain. Review of Systems  Constitutional: Negative.   HENT: Negative.    Eyes: Negative.   Respiratory: Negative.    Cardiovascular: Negative.   Endocrine: Negative.   Musculoskeletal: Negative.   Neurological: Negative.   Hematological: Negative.   Psychiatric/Behavioral: Negative.    All other systems reviewed and are negative.    Objective: Vital Signs: There were no vitals taken for this visit.  Physical Exam Vitals and nursing note reviewed.  Constitutional:      Appearance: She is well-developed.  HENT:     Head: Atraumatic.     Nose: Nose normal.  Eyes:     Extraocular Movements: Extraocular movements intact.  Cardiovascular:     Pulses: Normal pulses.  Pulmonary:     Effort: Pulmonary effort is normal.  Abdominal:     Palpations: Abdomen is soft.  Musculoskeletal:     Cervical back: Neck supple.  Skin:    General: Skin is warm.     Capillary Refill: Capillary refill takes less than 2 seconds.  Neurological:     Mental Status: She is alert. Mental status is at baseline.  Psychiatric:        Behavior: Behavior normal.        Thought  Content: Thought content normal.        Judgment: Judgment normal.     Ortho Exam Examination of the right knee shows pain and crepitus with range of motion.  Pain with ambulation.  Collaterals and cruciates are stable. Specialty Comments:  No specialty comments available.  Imaging: No results found.   PMFS History: Patient Active Problem List   Diagnosis Date Noted   Primary osteoarthritis of right knee 08/12/2019   Past Medical History:  Diagnosis Date   Hypothyroidism      Family History  Problem Relation Age of Onset   Breast cancer Mother 21       diagnosed in 2016    Past Surgical History:  Procedure Laterality Date   ablasion     COLONOSCOPY N/A 03/14/2016   Procedure: COLONOSCOPY;  Surgeon: Gladis RAYMOND Mariner, MD;  Location: [facility] ENDOSCOPY;  Service: Endoscopy;  Laterality: N/A;   LAPAROSCOPIC GASTRIC SLEEVE RESECTION     TONSILLECTOMY     Social History   Occupational History   Not on file  Tobacco Use   Smoking status: Never   Smokeless tobacco: Never  Substance and Sexual Activity   Alcohol use: Not on file   Drug use: Not on file   Sexual activity: Not on file        "

## 2024-07-11 ENCOUNTER — Ambulatory Visit: Admitting: Physical Therapy
